# Patient Record
Sex: Female | Born: 1990 | Race: Black or African American | Hispanic: No | Marital: Single | Smoking: Never smoker
Health system: Southern US, Community
[De-identification: ages and names within clinical notes are randomized; demographics above are authoritative.]

---

## 2012-08-23 ENCOUNTER — Emergency Department (HOSPITAL_COMMUNITY)
Admission: EM | Admit: 2012-08-23 | Discharge: 2012-08-23 | Disposition: A | Discharge status: Home / Self Care | Payer: BC Managed Care – PPO | Attending: Emergency Medicine | Admitting: Emergency Medicine

## 2012-08-23 ENCOUNTER — Encounter (HOSPITAL_COMMUNITY): Payer: Self-pay | Admitting: Emergency Medicine

## 2012-08-23 DIAGNOSIS — Z79899 Other long term (current) drug therapy: Secondary | ICD-10-CM | POA: Insufficient documentation

## 2012-08-23 DIAGNOSIS — N946 Dysmenorrhea, unspecified: Secondary | ICD-10-CM | POA: Insufficient documentation

## 2012-08-23 DIAGNOSIS — R109 Unspecified abdominal pain: Secondary | ICD-10-CM | POA: Insufficient documentation

## 2012-08-23 DIAGNOSIS — Z3202 Encounter for pregnancy test, result negative: Secondary | ICD-10-CM | POA: Insufficient documentation

## 2012-08-23 MED ORDER — HYDROCODONE-ACETAMINOPHEN 5-325 MG PO TABS: 1.0000 | ORAL_TABLET | Freq: Four times a day (QID) | ORAL | Status: DC | PRN

## 2012-08-23 MED ORDER — NAPROXEN 500 MG PO TABS: 500.0000 mg | ORAL_TABLET | Freq: Two times a day (BID) | ORAL | Status: DC

## 2012-08-23 MED ORDER — HYDROMORPHONE HCL PF 1 MG/ML IJ SOLN
1.0000 mg | Freq: Once | INTRAMUSCULAR | Status: AC
  Administered 2012-08-23: 1 mg via INTRAMUSCULAR
  Filled 2012-08-23: qty 1

## 2012-08-23 NOTE — ED Notes (Signed)
Patient is on menstrual cycle that began around 0200 this morning.  Patient c/o N/V x 3 and abdominal cramps since 0400 this morning.  Patient states she does not have a OBGYN here and usually comes to the ER to get pain medicine when this happens (which occurs every few months).  Patient also reports heavy bleeding but denies clots.

## 2012-08-23 NOTE — ED Notes (Signed)
Bed:WA15<BR> Expected date:<BR> Expected time:<BR> Means of arrival:<BR> Comments:<BR> Triage 3 

## 2012-08-23 NOTE — ED Notes (Signed)
Pt reports pain in her lower pelvic area bilaterally and both sides.

## 2012-08-23 NOTE — ED Provider Notes (Signed)
History    CSN: [id number] Arrival date & time 08/23/12  1558 First MD Initiated Contact with Patient 08/23/12 1632      Chief Complaint  Patient presents with  . Menstrual Problem    cramps, N/V    HPI Pt presents to the ED with menstural cramping.  The pain is moderate to severe in the lower abdomen and radiates to her lower back and legs. She tried taking excedrin and motrin without relief.  Pt often has this problem every few months.  She has had to come to the emergency room in the past.  She has seen a GYN in the past and was told everything is OK.  She does not see a GYN regularly. She denies fever, or vaginal discharge.  She did vomiting three times.  No diarrhea.  No past medical history on file.  No past surgical history on file.  No family history on file.  History  Substance Use Topics  . Smoking status: Never Smoker   . Smokeless tobacco: Not on file  . Alcohol Use: No    OB History    Grav Para Term Preterm Abortions TAB SAB Ect Mult Living                  Review of Systems  All other systems reviewed and are negative.    Allergies  Review of patient's allergies indicates no known allergies.  Home Medications   Current Outpatient Rx  Name  Route  Sig  Dispense  Refill  . ASPIRIN-ACETAMINOPHEN-CAFFEINE 250-250-65 MG PO TABS   Oral   Take 2 tablets by mouth every 6 (six) hours as needed. For pain.         . IBUPROFEN 200 MG PO TABS   Oral   Take 400 mg by mouth every 8 (eight) hours as needed. For pain.           BP 99/59  Pulse 77  Temp 98.2 F (36.8 C)  Resp 20  Ht 5' (1.524 m)  Wt 118 lb (53.524 kg)  BMI 23.05 kg/m2  SpO2 100%  LMP 08/23/2012  Physical Exam  Nursing note and vitals reviewed. Constitutional: She appears well-developed and well-nourished. No distress.  HENT:  Head: Normocephalic and atraumatic.  Right Ear: External ear normal.  Left Ear: External ear normal.  Eyes: Conjunctivae normal are normal. Right eye  exhibits no discharge. Left eye exhibits no discharge. No scleral icterus.  Neck: Neck supple. No tracheal deviation present.  Cardiovascular: Normal rate, regular rhythm and intact distal pulses.   Pulmonary/Chest: Effort normal and breath sounds normal. No stridor. No respiratory distress. She has no wheezes. She has no rales.  Abdominal: Soft. Bowel sounds are normal. She exhibits no distension. There is tenderness in the suprapubic area. There is no rebound and no guarding. No hernia.  Genitourinary: There is no rash, tenderness or lesion on the right labia. There is no rash, tenderness or lesion on the left labia. Uterus is tender. Uterus is not enlarged. Cervix exhibits no motion tenderness. Right adnexum displays no mass. Left adnexum displays no mass. There is bleeding around the vagina. No tenderness around the vagina. No foreign body around the vagina. No signs of injury around the vagina.       No clots, mild to mod bleeding from os  Musculoskeletal: She exhibits no edema and no tenderness.  Neurological: She is alert. She has normal strength. No sensory deficit. Cranial nerve deficit:  no gross defecits noted.  She exhibits normal muscle tone. She displays no seizure activity. Coordination normal.  Skin: Skin is warm and dry. No rash noted.  Psychiatric: She has a normal mood and affect.    ED Course  Procedures (including critical care time)   Labs Reviewed  POCT PREGNANCY, URINE  GC/CHLAMYDIA PROBE AMP  RPR   No results found.   1. Dysmenorrhea       MDM  Encouraged outpt follow up with a GYN.   Will dc home with nsaids and norco.  At this time there does not appear to be any evidence of an acute emergency medical condition and the patient appears stable for discharge with appropriate outpatient follow up.         Celene Kras, MD 08/23/12 [phone]

## 2013-05-18 ENCOUNTER — Emergency Department (HOSPITAL_BASED_OUTPATIENT_CLINIC_OR_DEPARTMENT_OTHER)
Admission: EM | Admit: 2013-05-18 | Discharge: 2013-05-18 | Disposition: A | Discharge status: Home / Self Care | Payer: BC Managed Care – PPO | Attending: Emergency Medicine | Admitting: Emergency Medicine

## 2013-05-18 ENCOUNTER — Encounter (HOSPITAL_BASED_OUTPATIENT_CLINIC_OR_DEPARTMENT_OTHER): Payer: Self-pay

## 2013-05-18 DIAGNOSIS — Z791 Long term (current) use of non-steroidal anti-inflammatories (NSAID): Secondary | ICD-10-CM | POA: Insufficient documentation

## 2013-05-18 DIAGNOSIS — N946 Dysmenorrhea, unspecified: Secondary | ICD-10-CM

## 2013-05-18 DIAGNOSIS — Z3202 Encounter for pregnancy test, result negative: Secondary | ICD-10-CM | POA: Insufficient documentation

## 2013-05-18 LAB — URINE MICROSCOPIC-ADD ON

## 2013-05-18 LAB — URINALYSIS, ROUTINE W REFLEX MICROSCOPIC
Glucose, UA: NEGATIVE mg/dL
Leukocytes, UA: NEGATIVE
Specific Gravity, Urine: 1.031 — ABNORMAL HIGH (ref 1.005–1.030)
pH: 8.5 — ABNORMAL HIGH (ref 5.0–8.0)

## 2013-05-18 LAB — WET PREP, GENITAL: Yeast Wet Prep HPF POC: NONE SEEN

## 2013-05-18 MED ORDER — SODIUM CHLORIDE 0.9 % IV BOLUS (SEPSIS)
1000.0000 mL | Freq: Once | INTRAVENOUS | Status: AC
  Administered 2013-05-18: 1000 mL via INTRAVENOUS

## 2013-05-18 MED ORDER — KETOROLAC TROMETHAMINE 30 MG/ML IJ SOLN
30.0000 mg | Freq: Once | INTRAMUSCULAR | Status: AC
  Administered 2013-05-18: 30 mg via INTRAVENOUS
  Filled 2013-05-18: qty 1

## 2013-05-18 MED ORDER — ONDANSETRON HCL 4 MG/2ML IJ SOLN
4.0000 mg | INTRAMUSCULAR | Status: AC
  Administered 2013-05-18: 4 mg via INTRAVENOUS
  Filled 2013-05-18: qty 2

## 2013-05-18 MED ORDER — IBUPROFEN 800 MG PO TABS: 800.0000 mg | ORAL_TABLET | Freq: Three times a day (TID) | ORAL | Status: DC

## 2013-05-18 NOTE — ED Notes (Signed)
Patient reports that she started her menstrual cycle today and has severe cramps. Reports took hydrocodone without relief, here requesting dilaudid. Reports that she gets that from her doctor for cramps

## 2013-05-18 NOTE — ED Provider Notes (Signed)
CSN: [id number]     Arrival date & time 05/18/13  1413 History   First MD Initiated Contact with Patient 05/18/13 1426     Chief Complaint  Patient presents with  . Menstrual Problem   (Consider location/radiation/quality/duration/timing/severity/associated sxs/prior Treatment) HPI Comments: Patient is a 22 year old female with a history of dysmenorrhea presents for painful menstrual cramping with onset this morning. Patient states that pain is present in her suprapubic region and radiating around to her right and left back. Patient denies any alleviating factors of her symptoms and states she took Vicodin without relief. Patient denies any aggravating factors of her symptoms and states the pain has been constant since onset. She describes the pain as a cramping, sharp sensation. She endorses similar episodes of dysmenorrhea in the past and states she began her menstrual cycle this morning. States that "dilaudid usually works" for her. Patient admits to associated nausea and denies associated fever, chest pain or shortness of breath, vomiting, diarrhea, melena or hematochezia, and urinary symptoms. Last BM yesterday which was normal in color and consistency.  The history is provided by the patient. No language interpreter was used.    History reviewed. No pertinent past medical history. History reviewed. No pertinent past surgical history. No family history on file. History  Substance Use Topics  . Smoking status: Never Smoker   . Smokeless tobacco: Not on file  . Alcohol Use: No   OB History   Grav Para Term Preterm Abortions TAB SAB Ect Mult Living                 Review of Systems  Constitutional: Negative for fever.  Respiratory: Negative for shortness of breath.   Cardiovascular: Negative for chest pain.  Gastrointestinal: Positive for nausea and abdominal pain. Negative for vomiting and diarrhea.  Genitourinary: Positive for vaginal bleeding (c/w menses) and pelvic pain. Negative  for dysuria and vaginal pain.  All other systems reviewed and are negative.   Allergies  Review of patient's allergies indicates no known allergies.  Home Medications   Current Outpatient Rx  Name  Route  Sig  Dispense  Refill  . HYDROcodone-acetaminophen (NORCO) 5-325 MG per tablet   Oral   Take 1-2 tablets by mouth every 6 (six) hours as needed for pain.   16 tablet   0   . ibuprofen (ADVIL,MOTRIN) 800 MG tablet   Oral   Take 1 tablet (800 mg total) by mouth 3 (three) times daily.   21 tablet   0    BP 111/55  Pulse 91  Temp(Src) 99.1 F (37.3 C) (Oral)  Resp 18  LMP 05/18/2013  Physical Exam  Nursing note and vitals reviewed. Constitutional: She is oriented to person, place, and time. She appears well-developed and well-nourished. No distress.  HENT:  Head: Normocephalic and atraumatic.  Eyes: Conjunctivae and EOM are normal. No scleral icterus.  Neck: Normal range of motion.  Cardiovascular: Normal rate, regular rhythm, normal heart sounds and intact distal pulses.   Pulmonary/Chest: Effort normal and breath sounds normal. No respiratory distress. She has no wheezes. She has no rales.  Abdominal: Soft. Bowel sounds are normal. She exhibits no distension and no mass. There is tenderness (suprapubic). There is guarding (voluntary). There is no rebound.  Genitourinary: Uterus normal. There is no rash, tenderness, lesion or injury on the right labia. There is no rash, tenderness, lesion or injury on the left labia. Uterus is not tender. Cervix exhibits no motion tenderness and no friability. Right adnexum  displays no mass, no tenderness and no fullness. Left adnexum displays no mass, no tenderness and no fullness. No tenderness around the vagina. No signs of injury around the vagina. No vaginal discharge found.  Blood from cervix and in vaginal vault consistent with menstruation. No adnexal, uterine, or cervical motion tenderness on chaperoned GYN exam.  Musculoskeletal:  Normal range of motion.  Neurological: She is alert and oriented to person, place, and time.  Skin: Skin is warm and dry. No rash noted. She is not diaphoretic. No erythema. No pallor.  Psychiatric: She has a normal mood and affect. Her behavior is normal.    ED Course  Procedures (including critical care time) Labs Review Labs Reviewed  WET PREP, GENITAL - Abnormal; Notable for the following:    Clue Cells Wet Prep HPF POC FEW (*)    WBC, Wet Prep HPF POC MANY (*)    All other components within normal limits  URINALYSIS, ROUTINE W REFLEX MICROSCOPIC - Abnormal; Notable for the following:    Color, Urine AMBER (*)    Specific Gravity, Urine 1.031 (*)    pH 8.5 (*)    Hgb urine dipstick LARGE (*)    Ketones, ur >80 (*)    Protein, ur 30 (*)    All other components within normal limits  URINE MICROSCOPIC-ADD ON - Abnormal; Notable for the following:    Bacteria, UA MANY (*)    All other components within normal limits  GC/CHLAMYDIA PROBE AMP  PREGNANCY, URINE   Imaging Review No results found.  MDM   1. Dysmenorrhea    22 year old female with a history of dysmenorrhea who presents for abdominal pain with onset of her menses today. Patient well and nontoxic appearing, afebrile, and hemodynamically stable. Physical exam findings as above. There is no adnexal tenderness, cervical motion tenderness, or uterine tenderness on GYN exam. Urine without evidence of infection; leukocytes and nitrates negative. Urine pregnancy negative. Tenderness to palpation of the abdomen improved on reexamination after patient given Toradol and IVF in ED. Patient also endorsing significant improvement in her symptoms since arrival. Do not believe further workup and imaging is warranted at this time given improved abdominal exam and known hx of dysmenorrhea. Believe patient to be appropriate for discharge with OB/GYN followup for further evaluation of symptoms. 800 mg ibuprofen prescribed for pain control and  referral to women's outpatient clinic provided. Return precautions discussed and patient are agreeable to plan with no unaddressed concerns.    Antony Madura, PA-C 05/18/13 1657

## 2013-05-21 NOTE — ED Provider Notes (Signed)
Medical screening examination/treatment/procedure(s) were performed by non-physician practitioner and as supervising physician I was immediately available for consultation/collaboration.  Derwood Kaplan, MD 05/21/13 2024

## 2013-11-16 ENCOUNTER — Emergency Department (HOSPITAL_COMMUNITY)
Admission: EM | Admit: 2013-11-16 | Discharge: 2013-11-16 | Disposition: A | Discharge status: Home / Self Care | Payer: BC Managed Care – PPO | Attending: Emergency Medicine | Admitting: Emergency Medicine

## 2013-11-16 ENCOUNTER — Encounter (HOSPITAL_COMMUNITY): Payer: Self-pay | Admitting: Emergency Medicine

## 2013-11-16 DIAGNOSIS — K648 Other hemorrhoids: Secondary | ICD-10-CM | POA: Insufficient documentation

## 2013-11-16 DIAGNOSIS — K645 Perianal venous thrombosis: Secondary | ICD-10-CM | POA: Insufficient documentation

## 2013-11-16 DIAGNOSIS — K625 Hemorrhage of anus and rectum: Secondary | ICD-10-CM | POA: Insufficient documentation

## 2013-11-16 DIAGNOSIS — K5900 Constipation, unspecified: Secondary | ICD-10-CM | POA: Insufficient documentation

## 2013-11-16 MED ORDER — IBUPROFEN 600 MG PO TABS: 600.0000 mg | ORAL_TABLET | Freq: Four times a day (QID) | ORAL | Status: DC | PRN

## 2013-11-16 MED ORDER — OXYCODONE-ACETAMINOPHEN 5-325 MG PO TABS
1.0000 | ORAL_TABLET | Freq: Once | ORAL | Status: AC
  Administered 2013-11-16: 1 via ORAL
  Filled 2013-11-16: qty 1

## 2013-11-16 MED ORDER — HYDROCODONE-ACETAMINOPHEN 5-325 MG PO TABS: 1.0000 | ORAL_TABLET | ORAL | Status: DC | PRN

## 2013-11-16 NOTE — Discharge Instructions (Signed)
Call for a follow up appointment with a Family or Primary Care Provider.  Return if Symptoms worsen.   Take medication as prescribed.Continue Sitz baths.

## 2013-11-16 NOTE — ED Provider Notes (Signed)
Medical screening examination/treatment/procedure(s) were conducted as a shared visit with non-physician practitioner(s) and myself.  I personally evaluated the patient during the encounter.   EKG Interpretation None      The patient seen by me. Patient with complaint of hemorrhoidal pain since Wednesday that was 3-4 days ago. The patient was sent in from urgent care for evaluation here. Patient on rectal examination has a thrombosed external hemorrhoid on the left side of the anus. Area thrombus is noted. Patient will be treated symptomatically since his been several days since the onset, these tend to improve at this point over the next couple days will treat with sitz baths and pain control. Do not recommend opening the thrombosed hemorrhoid at this time. Natural history of the external thrombosed hemorrhoids that after 2-3 days they start to improve naturally as the clot starts to break down.  Shelda JakesScott W. Kehlani Vancamp, MD 11/16/13 1700

## 2013-11-16 NOTE — ED Notes (Signed)
PA at bedside.

## 2013-11-16 NOTE — ED Notes (Signed)
Pt reports having an extremal hemorrhoid. Pt reports being seen at urgent care and was referred to ED. Pt standing in triage and doesn't want to sit down. Pt is A/O x4, in NAD, and vitals are WDL.

## 2013-11-16 NOTE — ED Provider Notes (Signed)
CSN: [id number]     Arrival date & time 11/16/13  1607 History   First MD Initiated Contact with Patient 11/16/13 1618     Chief Complaint  Patient presents with  . Hemorrhoids     (Consider location/radiation/quality/duration/timing/severity/associated sxs/prior Treatment) HPI Comments: Megan Sharp is a 23 y.o. Female, presenting the Emergency Department with a chief complaint of worsening rectal pain for 3 dyas.  The patient reports she was constipated for several days prior to the pain and swelling.  She reports Sitz baths, preparation H, and drinking salt water to loosen her stools.  She reports occasional blood on toilet paper.    The history is provided by the patient and medical records. No language interpreter was used.    History reviewed. No pertinent past medical history. History reviewed. No pertinent past surgical history. No family history on file. History  Substance Use Topics  . Smoking status: Never Smoker   . Smokeless tobacco: Never Used  . Alcohol Use: No   OB History   Grav Para Term Preterm Abortions TAB SAB Ect Mult Living                 Review of Systems  Gastrointestinal: Positive for constipation, anal bleeding and rectal pain.      Allergies  Review of patient's allergies indicates no known allergies.  Home Medications   Current Outpatient Rx  Name  Route  Sig  Dispense  Refill  . HYDROcodone-acetaminophen (NORCO) 5-325 MG per tablet   Oral   Take 1-2 tablets by mouth every 6 (six) hours as needed for pain.   16 tablet   0   . ibuprofen (ADVIL,MOTRIN) 800 MG tablet   Oral   Take 1 tablet (800 mg total) by mouth 3 (three) times daily.   21 tablet   0    BP 112/71  Pulse 77  Temp(Src) 98.3 F (36.8 C) (Oral)  Resp 18  SpO2 100%  LMP 11/16/2013 Physical Exam  Nursing note and vitals reviewed. Constitutional: She is oriented to person, place, and time. She appears well-developed and well-nourished.  Appears uncomfortable  HENT:   Head: Normocephalic and atraumatic.  Neck: Neck supple.  Genitourinary: Rectal exam shows external hemorrhoid, internal hemorrhoid and tenderness. Rectal exam shows no fissure.  Single thrombosed external hemorrhoid at the 8 o'clock position. Moderately to severely tender to palpation.   Musculoskeletal: Normal range of motion.  Neurological: She is alert and oriented to person, place, and time.  Skin: Skin is warm and dry.    ED Course  Procedures (including critical care time) Labs Review Labs Reviewed - No data to display Imaging Review No results found.   EKG Interpretation None      MDM   Final diagnoses:  Thrombosed external hemorrhoid   Hemorrhoids  Pt to ER with external thrombosed hemorrhoid. Home care discussed including sitz baths 15 min TID. Dc pain medications, increase water to stool softener and recommendation f-u with surgery or GI. Pt w normal VS and in NAD prior to dc.  Meds given in ED:  Medications  oxyCODONE-acetaminophen (PERCOCET/ROXICET) 5-325 MG per tablet 1 tablet (1 tablet Oral Given 11/16/13 1721)    Discharge Medication List as of 11/16/2013  5:37 PM    START taking these medications   Details  HYDROcodone-acetaminophen (NORCO/VICODIN) 5-325 MG per tablet Take 1 tablet by mouth every 4 (four) hours as needed., Starting 11/16/2013, Until Discontinued, Print    !! ibuprofen (ADVIL,MOTRIN) 600 MG tablet Take 1  tablet (600 mg total) by mouth every 6 (six) hours as needed. Take with meals, Starting 11/16/2013, Until Discontinued, Print     !! - Potential duplicate medications found. Please discuss with provider.         Clabe SealLauren M Leylani Duley, PA-C 11/18/13 2107

## 2014-02-15 ENCOUNTER — Encounter (HOSPITAL_COMMUNITY): Payer: Self-pay | Admitting: Emergency Medicine

## 2014-02-15 ENCOUNTER — Emergency Department (HOSPITAL_COMMUNITY)
Admission: EM | Admit: 2014-02-15 | Discharge: 2014-02-16 | Disposition: A | Discharge status: Home / Self Care | Payer: BC Managed Care – PPO | Attending: Emergency Medicine | Admitting: Emergency Medicine

## 2014-02-15 DIAGNOSIS — Z792 Long term (current) use of antibiotics: Secondary | ICD-10-CM | POA: Insufficient documentation

## 2014-02-15 DIAGNOSIS — N12 Tubulo-interstitial nephritis, not specified as acute or chronic: Secondary | ICD-10-CM | POA: Insufficient documentation

## 2014-02-15 DIAGNOSIS — Z7982 Long term (current) use of aspirin: Secondary | ICD-10-CM | POA: Insufficient documentation

## 2014-02-15 LAB — COMPREHENSIVE METABOLIC PANEL
ALT: 22 U/L (ref 0–35)
AST: 19 U/L (ref 0–37)
Albumin: 3.8 g/dL (ref 3.5–5.2)
Alkaline Phosphatase: 60 U/L (ref 39–117)
BUN: 14 mg/dL (ref 6–23)
CALCIUM: 9.1 mg/dL (ref 8.4–10.5)
CO2: 26 meq/L (ref 19–32)
CREATININE: 0.68 mg/dL (ref 0.50–1.10)
Chloride: 104 mEq/L (ref 96–112)
GLUCOSE: 92 mg/dL (ref 70–99)
Potassium: 3.7 mEq/L (ref 3.7–5.3)
SODIUM: 142 meq/L (ref 137–147)
TOTAL PROTEIN: 7.4 g/dL (ref 6.0–8.3)
Total Bilirubin: 0.2 mg/dL — ABNORMAL LOW (ref 0.3–1.2)

## 2014-02-15 LAB — CBC WITH DIFFERENTIAL/PLATELET
BASOS ABS: 0.1 10*3/uL (ref 0.0–0.1)
Basophils Relative: 1 % (ref 0–1)
EOS ABS: 0.1 10*3/uL (ref 0.0–0.7)
EOS PCT: 1 % (ref 0–5)
HEMATOCRIT: 35.3 % — AB (ref 36.0–46.0)
Hemoglobin: 11.8 g/dL — ABNORMAL LOW (ref 12.0–15.0)
LYMPHS ABS: 3.3 10*3/uL (ref 0.7–4.0)
LYMPHS PCT: 52 % — AB (ref 12–46)
MCH: 28.6 pg (ref 26.0–34.0)
MCHC: 33.4 g/dL (ref 30.0–36.0)
MCV: 85.5 fL (ref 78.0–100.0)
MONO ABS: 0.6 10*3/uL (ref 0.1–1.0)
Monocytes Relative: 9 % (ref 3–12)
Neutro Abs: 2.3 10*3/uL (ref 1.7–7.7)
Neutrophils Relative %: 37 % — ABNORMAL LOW (ref 43–77)
PLATELETS: 265 10*3/uL (ref 150–400)
RBC: 4.13 MIL/uL (ref 3.87–5.11)
RDW: 12 % (ref 11.5–15.5)
WBC: 6.2 10*3/uL (ref 4.0–10.5)

## 2014-02-15 LAB — URINALYSIS, ROUTINE W REFLEX MICROSCOPIC
BILIRUBIN URINE: NEGATIVE
GLUCOSE, UA: NEGATIVE mg/dL
KETONES UR: NEGATIVE mg/dL
NITRITE: NEGATIVE
Protein, ur: NEGATIVE mg/dL
Specific Gravity, Urine: 1.016 (ref 1.005–1.030)
Urobilinogen, UA: 0.2 mg/dL (ref 0.0–1.0)
pH: 7 (ref 5.0–8.0)

## 2014-02-15 LAB — PREGNANCY, URINE: PREG TEST UR: NEGATIVE

## 2014-02-15 LAB — URINE MICROSCOPIC-ADD ON

## 2014-02-15 LAB — LIPASE, BLOOD: LIPASE: 24 U/L (ref 11–59)

## 2014-02-15 MED ORDER — ONDANSETRON HCL 4 MG/2ML IJ SOLN
4.0000 mg | Freq: Once | INTRAMUSCULAR | Status: AC
  Administered 2014-02-15: 4 mg via INTRAVENOUS
  Filled 2014-02-15: qty 2

## 2014-02-15 MED ORDER — SODIUM CHLORIDE 0.9 % IV BOLUS (SEPSIS)
1000.0000 mL | Freq: Once | INTRAVENOUS | Status: AC
  Administered 2014-02-15: 1000 mL via INTRAVENOUS

## 2014-02-15 MED ORDER — MORPHINE SULFATE 4 MG/ML IJ SOLN
4.0000 mg | Freq: Once | INTRAMUSCULAR | Status: AC
  Administered 2014-02-15: 4 mg via INTRAVENOUS
  Filled 2014-02-15: qty 1

## 2014-02-15 MED ORDER — CIPROFLOXACIN HCL 500 MG PO TABS: 500.0000 mg | ORAL_TABLET | Freq: Two times a day (BID) | ORAL | Status: DC

## 2014-02-15 MED ORDER — DEXTROSE 5 % IV SOLN
1.0000 g | Freq: Once | INTRAVENOUS | Status: AC
  Administered 2014-02-15: 1 g via INTRAVENOUS
  Filled 2014-02-15: qty 10

## 2014-02-15 MED ORDER — ONDANSETRON HCL 4 MG PO TABS: 4.0000 mg | ORAL_TABLET | Freq: Four times a day (QID) | ORAL | Status: DC

## 2014-02-15 MED ORDER — HYDROCODONE-ACETAMINOPHEN 5-325 MG PO TABS: 1.0000 | ORAL_TABLET | ORAL | Status: DC | PRN

## 2014-02-15 NOTE — Discharge Instructions (Signed)

## 2014-02-15 NOTE — ED Notes (Signed)
Pt presents with c/o bilateral flank and abdominal pain as well. Pt says that she is currently on her cycle and she has been passing some blood clots today. Pt denies N/V/D. No hx of kidney stones, blood in her urine, or pain with urination. Pt is in NAD at this time.

## 2014-02-15 NOTE — ED Provider Notes (Signed)
CSN: [id number]     Arrival date & time 02/15/14  2128 History   First MD Initiated Contact with Patient 02/15/14 2145     Chief Complaint  Patient presents with  . Flank Pain  . Abdominal Pain     (Consider location/radiation/quality/duration/timing/severity/associated sxs/prior Treatment) HPI  Patient presents to the ER with complaints of bilateral flank pain that started yesterday. She describes it as being mild and uncomfortable starting yesterday but then today it started to become more painful. She is also on her menstrual cycle and has been passing blood clots which are larger than normal. She has not had any n,v,d. Denies hematuria, vaginal pain or abnormal discharge. The pain does not worsen with movement. Denies suprapubic pain. NAD at this time but accepts offer for pain medications.  History reviewed. No pertinent past medical history. History reviewed. No pertinent past surgical history. No family history on file. History  Substance Use Topics  . Smoking status: Never Smoker   . Smokeless tobacco: Never Used  . Alcohol Use: No   OB History   Grav Para Term Preterm Abortions TAB SAB Ect Mult Living                 Review of Systems   Review of Systems  Gen: no weight loss, fevers, chills, night sweats  Eyes: no discharge or drainage, no occular pain or visual changes  Nose: no epistaxis or rhinorrhea  Mouth: no dental pain, no sore throat  Neck: no neck pain  Lungs:No wheezing, coughing or hemoptysis CV: no chest pain, palpitations, dependent edema or orthopnea  Abd: + bilateral flank pain, NO nausea, vomiting, diarrhea GU: no dysuria or gross hematuria , + menstrual cramps MSK:  No muscle weakness or pain Neuro: no headache, no focal neurologic deficits  Skin: no rash or wounds Psyche: no complaints    Allergies  Review of patient's allergies indicates no known allergies.  Home Medications   Prior to Admission medications   Medication Sig Start Date  End Date Taking? Authorizing Provider  aspirin-acetaminophen-caffeine (EXCEDRIN EXTRA STRENGTH) [id number] MG per tablet Take 2 tablets by mouth every 6 (six) hours as needed for headache or migraine.   Yes Historical Provider, MD  ciprofloxacin (CIPRO) 500 MG tablet Take 1 tablet (500 mg total) by mouth 2 (two) times daily. 02/15/14   Hoyt Leanos Marilu Favre, PA-C  HYDROcodone-acetaminophen (NORCO/VICODIN) 5-325 MG per tablet Take 1-2 tablets by mouth every 4 (four) hours as needed. 02/15/14   Abril Cappiello Marilu Favre, PA-C  ondansetron (ZOFRAN) 4 MG tablet Take 1 tablet (4 mg total) by mouth every 6 (six) hours. 02/15/14   Kristianne Albin Marilu Favre, PA-C   BP 100/68  Pulse 60  Temp(Src) 97.9 F (36.6 C) (Oral)  Resp 18  SpO2 100%  LMP 02/15/2014 Physical Exam  Nursing note and vitals reviewed. Constitutional: She appears well-developed and well-nourished. No distress.  HENT:  Head: Normocephalic and atraumatic.  Eyes: Pupils are equal, round, and reactive to light.  Neck: Normal range of motion. Neck supple.  Cardiovascular: Normal rate and regular rhythm.   Pulmonary/Chest: Effort normal.  Abdominal: Soft. Bowel sounds are normal. She exhibits no distension. There is tenderness. There is CVA tenderness. There is no rigidity, no rebound, no guarding and negative Murphy's sign.  No suprapubic pain  Neurological: She is alert.  Skin: Skin is warm and dry.    ED Course  Procedures (including critical care time) Labs Review Labs Reviewed  CBC WITH DIFFERENTIAL - Abnormal; Notable  for the following:    Hemoglobin 11.8 (*)    HCT 35.3 (*)    Neutrophils Relative % 37 (*)    Lymphocytes Relative 52 (*)    All other components within normal limits  COMPREHENSIVE METABOLIC PANEL - Abnormal; Notable for the following:    Total Bilirubin 0.2 (*)    All other components within normal limits  URINALYSIS, ROUTINE W REFLEX MICROSCOPIC - Abnormal; Notable for the following:    APPearance CLOUDY (*)    Hgb urine  dipstick LARGE (*)    Leukocytes, UA MODERATE (*)    All other components within normal limits  URINE MICROSCOPIC-ADD ON - Abnormal; Notable for the following:    Bacteria, UA MANY (*)    All other components within normal limits  URINE CULTURE  LIPASE, BLOOD  PREGNANCY, URINE    Imaging Review No results found.   EKG Interpretation None      MDM   Final diagnoses:  Pyelonephritis    Patient has WNL white count on CBC and no n,v,d. Her bilateral flank pain and urinalysis are consistent with early pyelonephritis. Will treat with IV Rocephin 1 g, pain and nausea medications. She does not met criteria for inpatient and will be discharged with pain and nausea medication as well as Cipro for 2 weeks. Urine culture sent out. Patient is comfortable with diagnosis and plan. She appears well and pain controlled in the ED.  23 y.o.Megan Sharp's evaluation in the Emergency Department is complete. It has been determined that no acute conditions requiring further emergency intervention are present at this time. The patient/guardian have been advised of the diagnosis and plan. We have discussed signs and symptoms that warrant return to the ED, such as changes or worsening in symptoms.  Vital signs are stable at discharge. Filed Vitals:   02/16/14 0205  BP: 100/68  Pulse: 60  Temp: 97.9 F (36.6 C)  Resp: 18    Patient/guardian has voiced understanding and agreed to follow-up with the PCP or specialist.     Linus Mako, PA-C 02/16/14 1020  Linus Mako, PA-C 02/16/14 1020

## 2014-02-16 NOTE — ED Provider Notes (Signed)
Medical screening examination/treatment/procedure(s) were performed by non-physician practitioner and as supervising physician I was immediately available for consultation/collaboration.   EKG Interpretation None       Hurman Horn, MD 02/16/14 1441

## 2014-02-18 LAB — URINE CULTURE: Special Requests: NORMAL

## 2014-02-23 ENCOUNTER — Telehealth (HOSPITAL_BASED_OUTPATIENT_CLINIC_OR_DEPARTMENT_OTHER): Payer: Self-pay | Admitting: Emergency Medicine

## 2014-02-23 NOTE — Telephone Encounter (Signed)
+  Urine. Per pharmacist, patient treated with Cipro. Sensitive to same.

## 2014-08-28 ENCOUNTER — Emergency Department (HOSPITAL_COMMUNITY)
Admission: EM | Admit: 2014-08-28 | Discharge: 2014-08-29 | Disposition: A | Discharge status: Home / Self Care | Payer: BC Managed Care – PPO | Attending: Emergency Medicine | Admitting: Emergency Medicine

## 2014-08-28 ENCOUNTER — Encounter (HOSPITAL_COMMUNITY): Payer: Self-pay | Admitting: Emergency Medicine

## 2014-08-28 DIAGNOSIS — Z7982 Long term (current) use of aspirin: Secondary | ICD-10-CM | POA: Diagnosis not present

## 2014-08-28 DIAGNOSIS — R002 Palpitations: Secondary | ICD-10-CM | POA: Insufficient documentation

## 2014-08-28 DIAGNOSIS — R0602 Shortness of breath: Secondary | ICD-10-CM | POA: Diagnosis not present

## 2014-08-28 DIAGNOSIS — Z3202 Encounter for pregnancy test, result negative: Secondary | ICD-10-CM | POA: Diagnosis not present

## 2014-08-28 DIAGNOSIS — R42 Dizziness and giddiness: Secondary | ICD-10-CM | POA: Insufficient documentation

## 2014-08-28 DIAGNOSIS — Z79899 Other long term (current) drug therapy: Secondary | ICD-10-CM | POA: Diagnosis not present

## 2014-08-28 DIAGNOSIS — Z792 Long term (current) use of antibiotics: Secondary | ICD-10-CM | POA: Insufficient documentation

## 2014-08-28 DIAGNOSIS — R531 Weakness: Secondary | ICD-10-CM | POA: Diagnosis not present

## 2014-08-28 NOTE — ED Notes (Signed)
Pt states tonight about an hour prior to arrival she started feeling short of breath and light headed  Pt states her ribs and kidney area hurt

## 2014-08-29 LAB — CBC WITH DIFFERENTIAL/PLATELET
BASOS PCT: 1 % (ref 0–1)
Basophils Absolute: 0 10*3/uL (ref 0.0–0.1)
EOS ABS: 0 10*3/uL (ref 0.0–0.7)
Eosinophils Relative: 1 % (ref 0–5)
HCT: 35.9 % — ABNORMAL LOW (ref 36.0–46.0)
HEMOGLOBIN: 12 g/dL (ref 12.0–15.0)
Lymphocytes Relative: 45 % (ref 12–46)
Lymphs Abs: 2.2 10*3/uL (ref 0.7–4.0)
MCH: 28.1 pg (ref 26.0–34.0)
MCHC: 33.4 g/dL (ref 30.0–36.0)
MCV: 84.1 fL (ref 78.0–100.0)
MONO ABS: 0.6 10*3/uL (ref 0.1–1.0)
MONOS PCT: 13 % — AB (ref 3–12)
Neutro Abs: 1.9 10*3/uL (ref 1.7–7.7)
Neutrophils Relative %: 40 % — ABNORMAL LOW (ref 43–77)
Platelets: 240 10*3/uL (ref 150–400)
RBC: 4.27 MIL/uL (ref 3.87–5.11)
RDW: 12.1 % (ref 11.5–15.5)
WBC: 4.8 10*3/uL (ref 4.0–10.5)

## 2014-08-29 LAB — I-STAT BETA HCG BLOOD, ED (MC, WL, AP ONLY): I-stat hCG, quantitative: <5 m[IU]/mL (ref <5)

## 2014-08-29 LAB — BASIC METABOLIC PANEL
Anion gap: 12 (ref 5–15)
BUN: 9 mg/dL (ref 6–23)
CHLORIDE: 102 meq/L (ref 96–112)
CO2: 24 mEq/L (ref 19–32)
Calcium: 9.4 mg/dL (ref 8.4–10.5)
Creatinine, Ser: 0.66 mg/dL (ref 0.50–1.10)
Glucose, Bld: 124 mg/dL — ABNORMAL HIGH (ref 70–99)
Potassium: 4.3 mEq/L (ref 3.7–5.3)
Sodium: 138 mEq/L (ref 137–147)

## 2014-08-29 LAB — MAGNESIUM: Magnesium: 2.1 mg/dL (ref 1.5–2.5)

## 2014-08-29 NOTE — ED Provider Notes (Addendum)
CSN: [id number]     Arrival date & time 08/28/14  2253 History   First MD Initiated Contact with Patient 08/28/14 2341     Chief Complaint  Patient presents with  . Shortness of Breath  . Dizziness     (Consider location/radiation/quality/duration/timing/severity/associated sxs/prior Treatment) HPI  Megan Sharp is a 23 y.o. female with no significant past medical history coming in with sudden onset palpitations and shortness of breath. Patient states this began an hour prior to arrival while laying in her bed talking on the phone. She states when she stood up she became warm and dizzy as well. She denies any syncope. She has bilateral rib pain but denies any, fevers, infections. She is currently on her menstrual cycle and states it has been normal. She denies any medications. She denies any history of blood clots, long periods of immobilization, or hormone therapy. This has never happened to her in the past. Patient's no further complaints.  10 Systems reviewed and are negative for acute change except as noted in the HPI.    History reviewed. No pertinent past medical history. History reviewed. No pertinent past surgical history. Family History  Problem Relation Age of Onset  . Hypertension Other   . Diabetes Other    History  Substance Use Topics  . Smoking status: Never Smoker   . Smokeless tobacco: Never Used  . Alcohol Use: No   OB History    No data available     Review of Systems    Allergies  Review of patient's allergies indicates no known allergies.  Home Medications   Prior to Admission medications   Medication Sig Start Date End Date Taking? Authorizing Provider  aspirin-acetaminophen-caffeine (EXCEDRIN EXTRA STRENGTH) [phone] MG per tablet Take 2 tablets by mouth every 6 (six) hours as needed for headache or migraine.    Historical Provider, MD  ciprofloxacin (CIPRO) 500 MG tablet Take 1 tablet (500 mg total) by mouth 2 (two) times daily. 02/15/14   Tiffany  Irine SealG Greene, PA-C  HYDROcodone-acetaminophen (NORCO/VICODIN) 5-325 MG per tablet Take 1-2 tablets by mouth every 4 (four) hours as needed. 02/15/14   Tiffany Irine SealG Greene, PA-C  ondansetron (ZOFRAN) 4 MG tablet Take 1 tablet (4 mg total) by mouth every 6 (six) hours. 02/15/14   Tiffany Irine SealG Greene, PA-C   BP 113/60 mmHg  Pulse 84  Temp(Src) 98.1 F (36.7 C) (Oral)  Resp 16  SpO2 100%  LMP 08/02/2014 (Exact Date) Physical Exam  Constitutional: She is oriented to person, place, and time. She appears well-developed and well-nourished. No distress.  HENT:  Head: Normocephalic and atraumatic.  Nose: Nose normal.  Mouth/Throat: No oropharyngeal exudate.  Eyes: Conjunctivae and EOM are normal. Pupils are equal, round, and reactive to light. No scleral icterus.  Neck: Normal range of motion. Neck supple. No JVD present. No tracheal deviation present. No thyromegaly present.  Cardiovascular: Normal rate, regular rhythm and normal heart sounds.  Exam reveals no gallop and no friction rub.   No murmur heard. Pulmonary/Chest: Effort normal and breath sounds normal. No respiratory distress. She has no wheezes. She exhibits no tenderness.  Abdominal: Soft. Bowel sounds are normal. She exhibits no distension and no mass. There is no tenderness. There is no rebound and no guarding.  Musculoskeletal: Normal range of motion. She exhibits no edema or tenderness.  Lymphadenopathy:    She has no cervical adenopathy.  Neurological: She is alert and oriented to person, place, and time. No cranial nerve deficit. She exhibits  normal muscle tone.  Normal strength and sensation 4 extremities.  Skin: Skin is warm and dry. No rash noted. No erythema. No pallor.  Nursing note and vitals reviewed.   ED Course  Procedures (including critical care time) Labs Review Labs Reviewed  CBC WITH DIFFERENTIAL - Abnormal; Notable for the following:    HCT 35.9 (*)    Neutrophils Relative % 40 (*)    Monocytes Relative 13 (*)     All other components within normal limits  BASIC METABOLIC PANEL - Abnormal; Notable for the following:    Glucose, Bld 124 (*)    All other components within normal limits  MAGNESIUM  I-STAT BETA HCG BLOOD, ED ([facility], [facility], [facility] ONLY)    Imaging Review No results found.   EKG Interpretation   Date/Time:  Thursday August 28 2014 23:36:24 EST Ventricular Rate:  92 PR Interval:  130 QRS Duration: 74 QT Interval:  333 QTC Calculation: 412 R Axis:   67 Text Interpretation:  Sinus rhythm Baseline wander in lead(s) II III aVF  V5 Confirmed by Erroll Lunani, Rolfe Hartsell Ayokunle [phone]([id number]) on 08/28/2014 11:40:30 PM      MDM   Final diagnoses:  None    Patient presents emergency department for sudden onset palpitations, shortness of breath, weakness. Based on history and physical exam I cannot find an etiology for this. Will obtain electrolytes and pregnancy test for evaluation. EKG is normal, vital signs are normal.  Laboratory studies are normal. Pregnancy is negative. Patient continues to be asymptomatic in the emergency department. She is PERC negative.  She is advised to follow-up with her primary care physician, her vital signs were within her normal limits and she is safe for discharge.    Tomasita CrumbleAdeleke Clerence Gubser, MD 08/29/14 [id number]  Tomasita CrumbleAdeleke Ceaira Ernster, MD 08/29/14 [id number]

## 2014-08-29 NOTE — Discharge Instructions (Signed)
Palpitations Ms. Laughter, you were seen today for your heart racing and dizziness.  Your blood work, vital signs, EKG were all normal.  Follow up with a primary care physician within 3 days for continued treatment. If symptoms worsen come back to emergency department and medially for repeat evaluation. Thank you. A palpitation is the feeling that your heartbeat is irregular. It may feel like your heart is fluttering or skipping a beat. It may also feel like your heart is beating faster than normal. This is usually not a serious problem. In some cases, you may need more medical tests. HOME CARE  Avoid:  Caffeine in coffee, tea, soft drinks, diet pills, and energy drinks.  Chocolate.  Alcohol.  Stop smoking if you smoke.  Reduce your stress and anxiety. Try:  A method that measures bodily functions so you can learn to control them (biofeedback).  Yoga.  Meditation.  Physical activity such as swimming, jogging, or walking.  Get plenty of rest and sleep. GET HELP IF:  Your fast or irregular heartbeat continues after 24 hours.  Your palpitations occur more often. GET HELP RIGHT AWAY IF:   You have chest pain.  You feel short of breath.  You have a very bad headache.  You feel dizzy or pass out (faint). MAKE SURE YOU:   Understand these instructions.  Will watch your condition.  Will get help right away if you are not doing well or get worse. Document Released: 06/07/2008 Document Revised: 01/13/2014 Document Reviewed: 10/28/2011 [facility] Patient Information 2015 [employer], [place]. This information is not intended to replace advice given to you by your health care provider. Make sure you discuss any questions you have with your health care provider.

## 2014-10-14 ENCOUNTER — Encounter (HOSPITAL_COMMUNITY): Payer: Self-pay | Admitting: Emergency Medicine

## 2014-10-14 ENCOUNTER — Emergency Department (HOSPITAL_COMMUNITY)
Admission: EM | Admit: 2014-10-14 | Discharge: 2014-10-15 | Disposition: A | Discharge status: Home / Self Care | Payer: BLUE CROSS/BLUE SHIELD | Attending: Emergency Medicine | Admitting: Emergency Medicine

## 2014-10-14 ENCOUNTER — Emergency Department (HOSPITAL_COMMUNITY): Payer: BLUE CROSS/BLUE SHIELD

## 2014-10-14 DIAGNOSIS — Z792 Long term (current) use of antibiotics: Secondary | ICD-10-CM | POA: Diagnosis not present

## 2014-10-14 DIAGNOSIS — Z3202 Encounter for pregnancy test, result negative: Secondary | ICD-10-CM | POA: Insufficient documentation

## 2014-10-14 DIAGNOSIS — R079 Chest pain, unspecified: Secondary | ICD-10-CM | POA: Diagnosis not present

## 2014-10-14 DIAGNOSIS — R0602 Shortness of breath: Secondary | ICD-10-CM | POA: Insufficient documentation

## 2014-10-14 LAB — BASIC METABOLIC PANEL
ANION GAP: 7 (ref 5–15)
BUN: 12 mg/dL (ref 6–23)
CALCIUM: 8.6 mg/dL (ref 8.4–10.5)
CHLORIDE: 102 mmol/L (ref 96–112)
CO2: 26 mmol/L (ref 19–32)
Creatinine, Ser: 0.7 mg/dL (ref 0.50–1.10)
GFR calc Af Amer: >90 mL/min (ref >90)
GLUCOSE: 88 mg/dL (ref 70–99)
POTASSIUM: 3.8 mmol/L (ref 3.5–5.1)
SODIUM: 135 mmol/L (ref 135–145)

## 2014-10-14 LAB — CBC
HCT: 39.5 % (ref 36.0–46.0)
HEMOGLOBIN: 12.6 g/dL (ref 12.0–15.0)
MCH: 27.8 pg (ref 26.0–34.0)
MCHC: 31.9 g/dL (ref 30.0–36.0)
MCV: 87 fL (ref 78.0–100.0)
PLATELETS: 299 10*3/uL (ref 150–400)
RBC: 4.54 MIL/uL (ref 3.87–5.11)
RDW: 12.5 % (ref 11.5–15.5)
WBC: 5.9 10*3/uL (ref 4.0–10.5)

## 2014-10-14 LAB — I-STAT TROPONIN, ED: TROPONIN I, POC: 0 ng/mL (ref 0.00–0.08)

## 2014-10-14 LAB — D-DIMER, QUANTITATIVE: D-Dimer, Quant: <0.27 ug/mL-FEU (ref 0.00–0.48)

## 2014-10-14 LAB — POC URINE PREG, ED: Preg Test, Ur: NEGATIVE

## 2014-10-14 NOTE — ED Notes (Signed)
Pt states she has been having heaviness in her chest off and on for the past two weeks  Pt states she went to urgent care today and they did an EKG that was abnormal so they wanted her to follow up with cardiology but it could take up to a month to get in or she could come here to have a more in depth evaluation  Pt states she gets this heaviness in her chest with exertion and she feels like it is hard for her to breathe  Pt denies pain at this time

## 2014-10-14 NOTE — ED Provider Notes (Signed)
CSN: [id number]     Arrival date & time 10/14/14  1932 History   First MD Initiated Contact with Patient 10/14/14 2009     Chief Complaint  Patient presents with  . Chest Pain     (Consider location/radiation/quality/duration/timing/severity/associated sxs/prior Treatment) HPI Comments: Patient presents to the ER for evaluation of chest pain and shortness of breath. Patient reports that she first noticed the symptoms about 2 weeks ago. Patient reports that symptoms wax and wane. Pain is primarily on the right side of her chest, at the area of the lower ribs. She has noticed a shortness of breath as well. She has not had any cough or fever. Patient denies injury to the area. No abdominal pain, nausea, vomiting, diarrhea.  Patient is a 24 y.o. female presenting with chest pain.  Chest Pain Associated symptoms: shortness of breath     History reviewed. No pertinent past medical history. History reviewed. No pertinent past surgical history. Family History  Problem Relation Age of Onset  . Hypertension Other   . Diabetes Other    History  Substance Use Topics  . Smoking status: Never Smoker   . Smokeless tobacco: Never Used  . Alcohol Use: No   OB History    No data available     Review of Systems  Respiratory: Positive for shortness of breath.   Cardiovascular: Positive for chest pain.  All other systems reviewed and are negative.     Allergies  Review of patient's allergies indicates no known allergies.  Home Medications   Prior to Admission medications   Medication Sig Start Date End Date Taking? Authorizing Provider  aspirin-acetaminophen-caffeine (EXCEDRIN MIGRAINE) [phone] MG per tablet Take 2 tablets by mouth every 6 (six) hours as needed for headache (headache).    Yes Historical Provider, MD  ciprofloxacin (CIPRO) 500 MG tablet Take 1 tablet (500 mg total) by mouth 2 (two) times daily. Patient not taking: Reported on 08/29/2014 02/15/14   Linus Mako, PA-C   HYDROcodone-acetaminophen (NORCO/VICODIN) 5-325 MG per tablet Take 1-2 tablets by mouth every 4 (four) hours as needed. Patient not taking: Reported on 08/29/2014 02/15/14   Linus Mako, PA-C  ondansetron (ZOFRAN) 4 MG tablet Take 1 tablet (4 mg total) by mouth every 6 (six) hours. Patient not taking: Reported on 08/29/2014 02/15/14   Linus Mako, PA-C   BP 104/49 mmHg  Pulse 77  Temp(Src) 98.3 F (36.8 C) (Oral)  Resp 14  SpO2 100%  LMP 10/02/2014 (Exact Date) Physical Exam  Constitutional: She is oriented to person, place, and time. She appears well-developed and well-nourished. No distress.  HENT:  Head: Normocephalic and atraumatic.  Right Ear: Hearing normal.  Left Ear: Hearing normal.  Nose: Nose normal.  Mouth/Throat: Oropharynx is clear and moist and mucous membranes are normal.  Eyes: Conjunctivae and EOM are normal. Pupils are equal, round, and reactive to light.  Neck: Normal range of motion. Neck supple.  Cardiovascular: Regular rhythm, S1 normal and S2 normal.  Exam reveals no gallop and no friction rub.   No murmur heard. Pulmonary/Chest: Effort normal and breath sounds normal. No respiratory distress. She exhibits no tenderness.  Abdominal: Soft. Normal appearance and bowel sounds are normal. There is no hepatosplenomegaly. There is no tenderness. There is no rebound, no guarding, no tenderness at McBurney's point and negative Murphy's sign. No hernia.  Musculoskeletal: Normal range of motion.  Neurological: She is alert and oriented to person, place, and time. She has normal strength. No cranial  nerve deficit or sensory deficit. Coordination normal. GCS eye subscore is 4. GCS verbal subscore is 5. GCS motor subscore is 6.  Skin: Skin is warm, dry and intact. No rash noted. No cyanosis.  Psychiatric: She has a normal mood and affect. Her speech is normal and behavior is normal. Thought content normal.  Nursing note and vitals reviewed.   ED Course  Procedures  (including critical care time) Labs Review Labs Reviewed  CBC  BASIC METABOLIC PANEL  D-DIMER, QUANTITATIVE  I-STAT [place], ED  POC URINE PREG, ED    Imaging Review Dg Chest 2 View  10/14/2014   CLINICAL DATA:  Subacute onset of intermittent chest heaviness. Initial encounter.  EXAM: CHEST  2 VIEW  COMPARISON:  None.  FINDINGS: The lungs are well-aerated. Mild peribronchial thickening is noted. There is no evidence of focal opacification, pleural effusion or pneumothorax.  The heart is normal in size; the mediastinal contour is within normal limits. No acute osseous abnormalities are seen.  IMPRESSION: Mild peribronchial thickening noted; lungs otherwise clear.   Electronically Signed   By: Garald Balding M.D.   On: 10/14/2014 21:07     EKG Interpretation   Date/Time:  Tuesday October 14 2014 20:58:20 EST Ventricular Rate:  79 PR Interval:  130 QRS Duration: 72 QT Interval:  365 QTC Calculation: 418 R Axis:   71 Text Interpretation:  Sinus rhythm Baseline wander in lead(s) V3 Normal  ECG Confirmed by POLLINA  MD, CHRISTOPHER [phone]) on 10/14/2014 9:13:06 PM      MDM   Final diagnoses:  Shortness of breath   chest pain  Patient presents to the ER for evaluation of chest pain and shortness of breath. Symptoms began ongoing for 2 weeks. Pain is mostly on the right side of her chest. She feels short of breath as well. Patient was seen in urgent care and was told that her EKG was abnormal. EKG here, however, was normal. Troponin was negative. She is BRCA negative and d-dimer was negative. No concern for PE. Chest x-ray shows peribronchial thickening, but no evidence of pneumonia or pneumothorax. Patient reassured, will be treated symptomatically. She is appropriate for discharge.    Orpah Greek, MD 10/15/14 [phone]

## 2014-10-15 MED ORDER — ALBUTEROL SULFATE HFA 108 (90 BASE) MCG/ACT IN AERS: 2.0000 | INHALATION_SPRAY | RESPIRATORY_TRACT | Status: AC | PRN

## 2014-10-15 MED ORDER — HYDROCODONE-ACETAMINOPHEN 5-325 MG PO TABS: 2.0000 | ORAL_TABLET | ORAL | Status: DC | PRN

## 2014-10-15 NOTE — Discharge Instructions (Signed)

## 2014-11-30 ENCOUNTER — Emergency Department (HOSPITAL_COMMUNITY)
Admission: EM | Admit: 2014-11-30 | Discharge: 2014-11-30 | Disposition: A | Discharge status: Home / Self Care | Payer: BLUE CROSS/BLUE SHIELD | Attending: Emergency Medicine | Admitting: Emergency Medicine

## 2014-11-30 ENCOUNTER — Encounter (HOSPITAL_COMMUNITY): Payer: Self-pay | Admitting: *Deleted

## 2014-11-30 DIAGNOSIS — R102 Pelvic and perineal pain: Secondary | ICD-10-CM | POA: Diagnosis present

## 2014-11-30 DIAGNOSIS — N946 Dysmenorrhea, unspecified: Secondary | ICD-10-CM | POA: Diagnosis not present

## 2014-11-30 DIAGNOSIS — R630 Anorexia: Secondary | ICD-10-CM | POA: Diagnosis not present

## 2014-11-30 DIAGNOSIS — Z3202 Encounter for pregnancy test, result negative: Secondary | ICD-10-CM | POA: Insufficient documentation

## 2014-11-30 DIAGNOSIS — Z79899 Other long term (current) drug therapy: Secondary | ICD-10-CM | POA: Insufficient documentation

## 2014-11-30 LAB — CBC WITH DIFFERENTIAL/PLATELET
BASOS ABS: 0 10*3/uL (ref 0.0–0.1)
Basophils Relative: 0 % (ref 0–1)
EOS ABS: 0 10*3/uL (ref 0.0–0.7)
Eosinophils Relative: 0 % (ref 0–5)
HCT: 42.2 % (ref 36.0–46.0)
Hemoglobin: 14 g/dL (ref 12.0–15.0)
LYMPHS ABS: 0.5 10*3/uL — AB (ref 0.7–4.0)
Lymphocytes Relative: 7 % — ABNORMAL LOW (ref 12–46)
MCH: 28.1 pg (ref 26.0–34.0)
MCHC: 33.2 g/dL (ref 30.0–36.0)
MCV: 84.7 fL (ref 78.0–100.0)
MONOS PCT: 8 % (ref 3–12)
Monocytes Absolute: 0.6 10*3/uL (ref 0.1–1.0)
NEUTROS PCT: 85 % — AB (ref 43–77)
Neutro Abs: 6.2 10*3/uL (ref 1.7–7.7)
Platelets: 258 10*3/uL (ref 150–400)
RBC: 4.98 MIL/uL (ref 3.87–5.11)
RDW: 12.1 % (ref 11.5–15.5)
WBC: 7.3 10*3/uL (ref 4.0–10.5)

## 2014-11-30 LAB — URINALYSIS, ROUTINE W REFLEX MICROSCOPIC
Glucose, UA: NEGATIVE mg/dL
Ketones, ur: 40 mg/dL — AB
Leukocytes, UA: NEGATIVE
Nitrite: NEGATIVE
PROTEIN: NEGATIVE mg/dL
Specific Gravity, Urine: 1.036 — ABNORMAL HIGH (ref 1.005–1.030)
Urobilinogen, UA: 1 mg/dL (ref 0.0–1.0)
pH: 5.5 (ref 5.0–8.0)

## 2014-11-30 LAB — COMPREHENSIVE METABOLIC PANEL
ALT: 27 U/L (ref 0–35)
AST: 26 U/L (ref 0–37)
Albumin: 4.2 g/dL (ref 3.5–5.2)
Alkaline Phosphatase: 51 U/L (ref 39–117)
Anion gap: 11 (ref 5–15)
BILIRUBIN TOTAL: 1 mg/dL (ref 0.3–1.2)
BUN: 11 mg/dL (ref 6–23)
CHLORIDE: 107 mmol/L (ref 96–112)
CO2: 21 mmol/L (ref 19–32)
Calcium: 8.9 mg/dL (ref 8.4–10.5)
Creatinine, Ser: 0.69 mg/dL (ref 0.50–1.10)
GFR calc Af Amer: >90 mL/min (ref >90)
GLUCOSE: 107 mg/dL — AB (ref 70–99)
POTASSIUM: 3.8 mmol/L (ref 3.5–5.1)
Sodium: 139 mmol/L (ref 135–145)
Total Protein: 7.7 g/dL (ref 6.0–8.3)

## 2014-11-30 LAB — URINE MICROSCOPIC-ADD ON

## 2014-11-30 LAB — POC URINE PREG, ED: PREG TEST UR: NEGATIVE

## 2014-11-30 MED ORDER — ONDANSETRON HCL 4 MG PO TABS: 4.0000 mg | ORAL_TABLET | Freq: Four times a day (QID) | ORAL | Status: DC

## 2014-11-30 MED ORDER — HYDROCODONE-ACETAMINOPHEN 5-325 MG PO TABS: 2.0000 | ORAL_TABLET | ORAL | Status: DC | PRN

## 2014-11-30 MED ORDER — KETOROLAC TROMETHAMINE 60 MG/2ML IM SOLN
30.0000 mg | Freq: Once | INTRAMUSCULAR | Status: AC
  Administered 2014-11-30: 30 mg via INTRAMUSCULAR
  Filled 2014-11-30: qty 2

## 2014-11-30 MED ORDER — ONDANSETRON 4 MG PO TBDP
4.0000 mg | ORAL_TABLET | Freq: Once | ORAL | Status: AC
  Administered 2014-11-30: 4 mg via ORAL
  Filled 2014-11-30: qty 1

## 2014-11-30 NOTE — ED Provider Notes (Addendum)
CSN: [id number]     Arrival date & time 11/30/14  1145 History   First MD Initiated Contact with Patient 11/30/14 1322     Chief Complaint  Patient presents with  . Abdominal Pain     (Consider location/radiation/quality/duration/timing/severity/associated sxs/prior Treatment) Patient is a 24 y.o. female presenting with abdominal pain. The history is provided by the patient.  Abdominal Pain Pain location: pelvic. Pain quality: aching and cramping   Pain radiates to:  Back Pain severity:  Severe Onset quality:  Gradual Duration:  1 day Timing:  Constant Progression:  Unchanged Chronicity:  Recurrent Context comment:  Started with menses yesterday Relieved by:  Nothing Worsened by:  Nothing tried Ineffective treatments:  NSAIDs Associated symptoms: anorexia, nausea, vaginal bleeding and vomiting   Associated symptoms: no cough, no diarrhea, no dysuria and no fever   Associated symptoms comment:  Pt states saw her pcp on Friday for a thicker d/c without itching or smell and had all std testing done.  Should get results tomorrow.  She saws pain today is like her typical severe menses Risk factors: NSAID use     History reviewed. No pertinent past medical history. History reviewed. No pertinent past surgical history. Family History  Problem Relation Age of Onset  . Hypertension Other   . Diabetes Other    History  Substance Use Topics  . Smoking status: Never Smoker   . Smokeless tobacco: Never Used  . Alcohol Use: No   OB History    No data available     Review of Systems  Constitutional: Negative for fever.  Respiratory: Negative for cough.   Gastrointestinal: Positive for nausea, vomiting, abdominal pain and anorexia. Negative for diarrhea.  Genitourinary: Positive for vaginal bleeding. Negative for dysuria.  All other systems reviewed and are negative.     Allergies  Review of patient's allergies indicates no known allergies.  Home Medications   Prior to  Admission medications   Medication Sig Start Date End Date Taking? Authorizing Provider  albuterol (PROVENTIL HFA;VENTOLIN HFA) 108 (90 BASE) MCG/ACT inhaler Inhale 2 puffs into the lungs every 4 (four) hours as needed for wheezing or shortness of breath. Patient not taking: Reported on 11/30/2014 10/15/14   Gilda Creasehristopher J Pollina, MD  aspirin-acetaminophen-caffeine (EXCEDRIN MIGRAINE) [phone] MG per tablet Take 1-2 tablets by mouth every 6 (six) hours as needed for headache.     Historical Provider, MD  HYDROcodone-acetaminophen (NORCO/VICODIN) 5-325 MG per tablet Take 2 tablets by mouth every 4 (four) hours as needed for moderate pain. Patient not taking: Reported on 11/30/2014 10/15/14   Gilda Creasehristopher J Pollina, MD   BP 107/55 mmHg  Pulse 96  Temp(Src) 97.8 F (36.6 C) (Oral)  Resp 16  SpO2 96%  LMP 11/29/2014 Physical Exam  Constitutional: She is oriented to person, place, and time. She appears well-developed and well-nourished. No distress.  HENT:  Head: Normocephalic and atraumatic.  Mouth/Throat: Oropharynx is clear and moist.  Eyes: Conjunctivae and EOM are normal. Pupils are equal, round, and reactive to light.  Neck: Normal range of motion. Neck supple.  Cardiovascular: Normal rate, regular rhythm and intact distal pulses.   No murmur heard. Pulmonary/Chest: Effort normal and breath sounds normal. No respiratory distress. She has no wheezes. She has no rales.  Abdominal: Soft. She exhibits no distension. There is tenderness in the right lower quadrant, suprapubic area and left lower quadrant. There is no rebound, no guarding and no CVA tenderness.    Musculoskeletal: Normal range of motion. She  exhibits tenderness. She exhibits no edema.       Lumbar back: She exhibits tenderness.       Back:  Neurological: She is alert and oriented to person, place, and time.  Skin: Skin is warm and dry. No rash noted. No erythema.  Psychiatric: She has a normal mood and affect. Her behavior is  normal.  Nursing note and vitals reviewed.   ED Course  Procedures (including critical care time) Labs Review Labs Reviewed  CBC WITH DIFFERENTIAL/PLATELET - Abnormal; Notable for the following:    Neutrophils Relative % 85 (*)    Lymphocytes Relative 7 (*)    Lymphs Abs 0.5 (*)    All other components within normal limits  COMPREHENSIVE METABOLIC PANEL - Abnormal; Notable for the following:    Glucose, Bld 107 (*)    All other components within normal limits  URINALYSIS, ROUTINE W REFLEX MICROSCOPIC - Abnormal; Notable for the following:    Color, Urine AMBER (*)    APPearance CLOUDY (*)    Specific Gravity, Urine 1.036 (*)    Hgb urine dipstick LARGE (*)    Bilirubin Urine SMALL (*)    Ketones, ur 40 (*)    All other components within normal limits  URINE MICROSCOPIC-ADD ON  POC URINE PREG, ED    Imaging Review No results found.   EKG Interpretation None      MDM   Final diagnoses:  Dysmenorrhea    Patient here complaining of severe abdominal cramping with her period. She states this happens every 2 or 3 months. She says recently on Friday 2 days prior to arrival she was seen her PCP office and had STD testing done because she had a thicker discharge the normal but denies any itching, new sexual contacts or fevers. Patient is waiting to get the results back and at this time defers further pelvic exam. Fill today is most likely dysmenorrhea. Also because patient is having this infrequently will have her follow-up with OB/GYN.  After Toradol and Zofran patient is feeling much better was discharged.    Gwyneth Sprout, MD 11/30/14 1544  Gwyneth Sprout, MD 11/30/14 1610

## 2014-11-30 NOTE — ED Notes (Signed)
Pt sts her period started yesterday and she is having severe cramps with nausea and vomiting.

## 2014-11-30 NOTE — Discharge Instructions (Signed)

## 2015-05-04 ENCOUNTER — Encounter (HOSPITAL_COMMUNITY): Payer: Self-pay | Admitting: Emergency Medicine

## 2015-05-04 ENCOUNTER — Emergency Department (HOSPITAL_COMMUNITY)
Admission: EM | Admit: 2015-05-04 | Discharge: 2015-05-05 | Disposition: A | Discharge status: Home / Self Care | Payer: BLUE CROSS/BLUE SHIELD | Attending: Emergency Medicine | Admitting: Emergency Medicine

## 2015-05-04 DIAGNOSIS — R102 Pelvic and perineal pain: Secondary | ICD-10-CM

## 2015-05-04 DIAGNOSIS — N72 Inflammatory disease of cervix uteri: Secondary | ICD-10-CM | POA: Diagnosis not present

## 2015-05-04 DIAGNOSIS — R1030 Lower abdominal pain, unspecified: Secondary | ICD-10-CM | POA: Diagnosis present

## 2015-05-04 DIAGNOSIS — Z3202 Encounter for pregnancy test, result negative: Secondary | ICD-10-CM | POA: Insufficient documentation

## 2015-05-04 LAB — COMPREHENSIVE METABOLIC PANEL
ALBUMIN: 4.1 g/dL (ref 3.5–5.0)
ALT: 18 U/L (ref 14–54)
AST: 21 U/L (ref 15–41)
Alkaline Phosphatase: 51 U/L (ref 38–126)
Anion gap: 5 (ref 5–15)
BUN: 10 mg/dL (ref 6–20)
CHLORIDE: 107 mmol/L (ref 101–111)
CO2: 27 mmol/L (ref 22–32)
Calcium: 9 mg/dL (ref 8.9–10.3)
Creatinine, Ser: 0.71 mg/dL (ref 0.44–1.00)
GFR calc Af Amer: >60 mL/min (ref >60)
GFR calc non Af Amer: >60 mL/min (ref >60)
Glucose, Bld: 95 mg/dL (ref 65–99)
POTASSIUM: 3.3 mmol/L — AB (ref 3.5–5.1)
SODIUM: 139 mmol/L (ref 135–145)
Total Bilirubin: 0.7 mg/dL (ref 0.3–1.2)
Total Protein: 7.3 g/dL (ref 6.5–8.1)

## 2015-05-04 LAB — CBC
HEMATOCRIT: 39.2 % (ref 36.0–46.0)
Hemoglobin: 12.9 g/dL (ref 12.0–15.0)
MCH: 28 pg (ref 26.0–34.0)
MCHC: 32.9 g/dL (ref 30.0–36.0)
MCV: 85 fL (ref 78.0–100.0)
Platelets: 282 10*3/uL (ref 150–400)
RBC: 4.61 MIL/uL (ref 3.87–5.11)
RDW: 12.2 % (ref 11.5–15.5)
WBC: 6.4 10*3/uL (ref 4.0–10.5)

## 2015-05-04 LAB — LIPASE, BLOOD: LIPASE: 19 U/L — AB (ref 22–51)

## 2015-05-04 NOTE — ED Notes (Signed)
Pt A+ox4, reports c/o lower abd pain, low back pain and BLE pain, pt reports "menstrual cramps", being followed by OBGYN for possible cysts.  Pt denies other complaints.  Skin PWD.  MAEI.  Speaking full/clear sentences, rr even/un-lab.  NAD.

## 2015-05-04 NOTE — ED Provider Notes (Signed)
CSN: [id number]     Arrival date & time 05/04/15  1854 History   First MD Initiated Contact with Patient 05/04/15 2359     Chief Complaint  Patient presents with  . Abdominal Pain    menstrual cramps, onset today, being followed for possible cyst  . Back Pain    abd pain radiating into back     (Consider location/radiation/quality/duration/timing/severity/associated sxs/prior Treatment) HPI complains of lower abdominal pain and low back pain onset 5 days ago, constant. Pain is worse with lying supine improved with sitting upright. She started menstrual period yesterday. She denies urinary symptoms. Denies anorexia. Last bowel movement 2 days ago, normal. No nausea or vomiting. No other associated symptoms. Maximum temperature 102 5 days ago.  History reviewed. No pertinent past medical history. past medical history negative History reviewed. No pertinent past surgical history. Family History  Problem Relation Age of Onset  . Hypertension Other   . Diabetes Other    Social History  Substance Use Topics  . Smoking status: Never Smoker   . Smokeless tobacco: Never Used  . Alcohol Use: No   OB History    No data available     Review of Systems  Constitutional: Negative.   HENT: Negative.   Respiratory: Negative.   Cardiovascular: Negative.   Gastrointestinal: Positive for abdominal pain.  Genitourinary: Positive for vaginal bleeding.       Currently on menses  Musculoskeletal: Positive for back pain.  Skin: Negative.   Neurological: Negative.   Psychiatric/Behavioral: Negative.   All other systems reviewed and are negative.     Allergies  Review of patient's allergies indicates no known allergies.  Home Medications   Prior to Admission medications   Medication Sig Start Date End Date Taking? Authorizing Provider  aspirin-acetaminophen-caffeine (EXCEDRIN MIGRAINE) [phone] MG per tablet Take 1-2 tablets by mouth every 6 (six) hours as needed for headache.    Yes  Historical Provider, MD  albuterol (PROVENTIL HFA;VENTOLIN HFA) 108 (90 BASE) MCG/ACT inhaler Inhale 2 puffs into the lungs every 4 (four) hours as needed for wheezing or shortness of breath. Patient not taking: Reported on 11/30/2014 10/15/14   Gilda Crease, MD  HYDROcodone-acetaminophen (NORCO/VICODIN) 5-325 MG per tablet Take 2 tablets by mouth every 4 (four) hours as needed. Patient not taking: Reported on 05/04/2015 11/30/14   Gwyneth Sprout, MD  ondansetron (ZOFRAN) 4 MG tablet Take 1 tablet (4 mg total) by mouth every 6 (six) hours. Patient not taking: Reported on 05/04/2015 11/30/14   Gwyneth Sprout, MD   BP 115/80 mmHg  Pulse 70  Temp(Src) 98.3 F (36.8 C) (Oral)  Resp 20  Ht  (1.499 m)  Wt 118 lb (53.524 kg)  BMI 23.82 kg/m2  SpO2 99%  LMP 05/04/2015 Physical Exam  Constitutional: She appears well-developed and well-nourished. No distress.  HENT:  Head: Normocephalic and atraumatic.  Eyes: Conjunctivae are normal. Pupils are equal, round, and reactive to light.  Neck: Neck supple. No tracheal deviation present. No thyromegaly present.  Cardiovascular: Normal rate and regular rhythm.   No murmur heard. Pulmonary/Chest: Effort normal and breath sounds normal.  Abdominal: Soft. Bowel sounds are normal. She exhibits no distension. There is no tenderness.  Genitourinary:  No flank tenderness. No external lesion. Dark blood in vaginal vault cervical os closed. Positive cervical motion tenderness no adnexal masses or tenderness  Musculoskeletal: Normal range of motion. She exhibits no edema or tenderness.  Neurological: She is alert. Coordination normal.  Skin: Skin is warm  and dry. No rash noted.  Psychiatric: She has a normal mood and affect.  Nursing note and vitals reviewed.   ED Course  Procedures (including critical care time) Labs Review Labs Reviewed  LIPASE, BLOOD - Abnormal; Notable for the following:    Lipase 19 (*)    All other components within  normal limits  COMPREHENSIVE METABOLIC PANEL - Abnormal; Notable for the following:    Potassium 3.3 (*)    All other components within normal limits  CBC  URINALYSIS, ROUTINE W REFLEX MICROSCOPIC (NOT AT [facility])  URINALYSIS, ROUTINE W REFLEX MICROSCOPIC (NOT AT [facility])  POC URINE PREG, ED  I-STAT BETA HCG BLOOD, ED ([facility], [facility], [facility] ONLY)    Imaging Review No results found. I have personally reviewed and evaluated these images and lab results as part of my medical decision-making.   EKG Interpretation None     2:10 AM patient feels much improved after treatment with intravenous morphine Results for orders placed or performed during the hospital encounter of 05/04/15  Wet prep, genital  Result Value Ref Range   Yeast Wet Prep HPF POC NONE SEEN NONE SEEN   Trich, Wet Prep NONE SEEN NONE SEEN   Clue Cells Wet Prep HPF POC NONE SEEN NONE SEEN   WBC, Wet Prep HPF POC RARE (A) NONE SEEN  Lipase, blood  Result Value Ref Range   Lipase 19 (L) 22 - 51 U/L  Comprehensive metabolic panel  Result Value Ref Range   Sodium 139 135 - 145 mmol/L   Potassium 3.3 (L) 3.5 - 5.1 mmol/L   Chloride 107 101 - 111 mmol/L   CO2 27 22 - 32 mmol/L   Glucose, Bld 95 65 - 99 mg/dL   BUN 10 6 - 20 mg/dL   Creatinine, Ser 1.61 0.44 - 1.00 mg/dL   Calcium 9.0 8.9 - 09.6 mg/dL   Total Protein 7.3 6.5 - 8.1 g/dL   Albumin 4.1 3.5 - 5.0 g/dL   AST 21 15 - 41 U/L   ALT 18 14 - 54 U/L   Alkaline Phosphatase 51 38 - 126 U/L   Total Bilirubin 0.7 0.3 - 1.2 mg/dL   GFR calc non Af Amer >60 >60 mL/min   GFR calc Af Amer >60 >60 mL/min   Anion gap 5 5 - 15  CBC  Result Value Ref Range   WBC 6.4 4.0 - 10.5 K/uL   RBC 4.61 3.87 - 5.11 MIL/uL   Hemoglobin 12.9 12.0 - 15.0 g/dL   HCT 04.5 40.9 - 81.1 %   MCV 85.0 78.0 - 100.0 fL   MCH 28.0 26.0 - 34.0 pg   MCHC 32.9 30.0 - 36.0 g/dL   RDW 91.4 78.2 - 95.6 %   Platelets 282 150 - 400 K/uL  Urinalysis, Routine w reflex microscopic (not at [facility])  Result Value Ref  Range   Color, Urine YELLOW YELLOW   APPearance TURBID (A) CLEAR   Specific Gravity, Urine 1.025 1.005 - 1.030   pH 7.5 5.0 - 8.0   Glucose, UA NEGATIVE NEGATIVE mg/dL   Hgb urine dipstick MODERATE (A) NEGATIVE   Bilirubin Urine NEGATIVE NEGATIVE   Ketones, ur NEGATIVE NEGATIVE mg/dL   Protein, ur NEGATIVE NEGATIVE mg/dL   Urobilinogen, UA 1.0 0.0 - 1.0 mg/dL   Nitrite NEGATIVE NEGATIVE   Leukocytes, UA NEGATIVE NEGATIVE  Urine microscopic-add on  Result Value Ref Range   Squamous Epithelial / LPF RARE RARE   WBC, UA 0-2 <3 WBC/hpf  RBC / HPF 7-10 <3 RBC/hpf   Bacteria, UA RARE RARE   Urine-Other AMORPHOUS URATES/PHOSPHATES   POC urine preg, ED (not at [facility])  Result Value Ref Range   Preg Test, Ur NEGATIVE NEGATIVE   US Transvaginal Non-ob  05/05/2015   CLINICAL DATA:  Pelvic and back pain for 1 week  EXAM: TRANSABDOMINAL AND TRANSVAGINAL ULTRASOUND OF PELVIS  TECHNIQUE: Both transabdominal and transvaginal ultrasound examinations of the pelvis were performed. Transabdominal technique was performed for global imaging of the pelvis including uterus, ovaries, adnexal regions, and pelvic cul-de-sac. It was necessary to proceed with endovaginal exam following the transabdominal exam to visualize the ovaries and endometrium.  COMPARISON:  None  FINDINGS: Uterus  Measurements: 6.7 x 3 x 4.3 cm. Anteverted. No fibroids or other mass visualized.  Endometrium  Thickness: 8 mm.  No focal abnormality visualized.  Right ovary  Measurements: 2.6 x 1.8 x 2.6 cm. Normal appearance/no adnexal mass.  Left ovary  Measurements: 2.8 x 1.8 x 2 cm. Normal appearance/no adnexal mass.  Other findings  Minimal free fluid in the pelvis.  IMPRESSION: Normal ultrasound appearance of the uterus and ovaries.   Electronically Signed   By: Burman Nieves M.D.   On: 05/05/2015 01:58   US Pelvis Complete  05/05/2015   CLINICAL DATA:  Pelvic and back pain for 1 week  EXAM: TRANSABDOMINAL AND TRANSVAGINAL ULTRASOUND OF  PELVIS  TECHNIQUE: Both transabdominal and transvaginal ultrasound examinations of the pelvis were performed. Transabdominal technique was performed for global imaging of the pelvis including uterus, ovaries, adnexal regions, and pelvic cul-de-sac. It was necessary to proceed with endovaginal exam following the transabdominal exam to visualize the ovaries and endometrium.  COMPARISON:  None  FINDINGS: Uterus  Measurements: 6.7 x 3 x 4.3 cm. Anteverted. No fibroids or other mass visualized.  Endometrium  Thickness: 8 mm.  No focal abnormality visualized.  Right ovary  Measurements: 2.6 x 1.8 x 2.6 cm. Normal appearance/no adnexal mass.  Left ovary  Measurements: 2.8 x 1.8 x 2 cm. Normal appearance/no adnexal mass.  Other findings  Minimal free fluid in the pelvis.  IMPRESSION: Normal ultrasound appearance of the uterus and ovaries.   Electronically Signed   By: Burman Nieves M.D.   On: 05/05/2015 01:58    MDM  Medically patient has cervicitis we'll treat empirically with Rocephin, Zithromax. Prescription Norco. Referral back to primary care physician for gynecology referral Final diagnoses:  None   diagnosis #1cervicitis #2 hypokalemia    Doug Sou, MD 05/05/15 9528

## 2015-05-05 ENCOUNTER — Emergency Department (HOSPITAL_COMMUNITY): Payer: BLUE CROSS/BLUE SHIELD

## 2015-05-05 LAB — URINALYSIS, ROUTINE W REFLEX MICROSCOPIC
Bilirubin Urine: NEGATIVE
Glucose, UA: NEGATIVE mg/dL
Ketones, ur: NEGATIVE mg/dL
Leukocytes, UA: NEGATIVE
Nitrite: NEGATIVE
Protein, ur: NEGATIVE mg/dL
SPECIFIC GRAVITY, URINE: 1.025 (ref 1.005–1.030)
UROBILINOGEN UA: 1 mg/dL (ref 0.0–1.0)
pH: 7.5 (ref 5.0–8.0)

## 2015-05-05 LAB — WET PREP, GENITAL
Clue Cells Wet Prep HPF POC: NONE SEEN
Trich, Wet Prep: NONE SEEN
Yeast Wet Prep HPF POC: NONE SEEN

## 2015-05-05 LAB — URINE MICROSCOPIC-ADD ON

## 2015-05-05 LAB — RPR: RPR: NONREACTIVE

## 2015-05-05 LAB — HIV ANTIBODY (ROUTINE TESTING W REFLEX): HIV SCREEN 4TH GENERATION: NONREACTIVE

## 2015-05-05 LAB — GC/CHLAMYDIA PROBE AMP (~~LOC~~) NOT AT ARMC
CHLAMYDIA, DNA PROBE: NEGATIVE
NEISSERIA GONORRHEA: NEGATIVE

## 2015-05-05 LAB — POC URINE PREG, ED: Preg Test, Ur: NEGATIVE

## 2015-05-05 MED ORDER — LIDOCAINE HCL (PF) 1 % IJ SOLN
INTRAMUSCULAR | Status: AC
  Administered 2015-05-05: 0.9 mL
  Filled 2015-05-05: qty 5

## 2015-05-05 MED ORDER — AZITHROMYCIN 250 MG PO TABS
1000.0000 mg | ORAL_TABLET | Freq: Once | ORAL | Status: AC
  Administered 2015-05-05: 1000 mg via ORAL
  Filled 2015-05-05: qty 4

## 2015-05-05 MED ORDER — POTASSIUM CHLORIDE CRYS ER 20 MEQ PO TBCR
40.0000 meq | EXTENDED_RELEASE_TABLET | Freq: Once | ORAL | Status: AC
  Administered 2015-05-05: 40 meq via ORAL
  Filled 2015-05-05: qty 2

## 2015-05-05 MED ORDER — HYDROCODONE-ACETAMINOPHEN 5-325 MG PO TABS: 1.0000 | ORAL_TABLET | Freq: Four times a day (QID) | ORAL | Status: DC | PRN

## 2015-05-05 MED ORDER — CEFTRIAXONE SODIUM 250 MG IJ SOLR
250.0000 mg | Freq: Once | INTRAMUSCULAR | Status: AC
  Administered 2015-05-05: 250 mg via INTRAMUSCULAR
  Filled 2015-05-05: qty 250

## 2015-05-05 MED ORDER — MORPHINE SULFATE (PF) 4 MG/ML IV SOLN
4.0000 mg | Freq: Once | INTRAVENOUS | Status: AC
  Administered 2015-05-05: 4 mg via INTRAVENOUS
  Filled 2015-05-05: qty 1

## 2015-05-05 NOTE — ED Notes (Signed)
Patient transported to Ultrasound 

## 2015-05-05 NOTE — ED Notes (Signed)
Pt c/o lower back pain x "just under a week" and lower abdominal pain starting today.  Denies abnormal vaginal discharge.  Pt reports that she is on her period. Pain score 8/10.

## 2015-05-05 NOTE — ED Notes (Signed)
Verbalized understanding discharge instructions and follow-up. In no acute distress.   

## 2015-05-05 NOTE — Discharge Instructions (Signed)
Take Advil for mild pain or the pain medicine prescribed for bad pain. Contact your primary care physician for referral to a gynecologist to arrange to be seen within the next one or 2 weeks. Return if your condition worsens for any reason.If you are sexually active, uses a condom each time that you have sex in order to prevent sexually transmitted diseases.

## 2015-10-12 ENCOUNTER — Emergency Department (HOSPITAL_COMMUNITY)
Admission: EM | Admit: 2015-10-12 | Discharge: 2015-10-13 | Disposition: A | Discharge status: Home / Self Care | Payer: BLUE CROSS/BLUE SHIELD | Attending: Emergency Medicine | Admitting: Emergency Medicine

## 2015-10-12 ENCOUNTER — Emergency Department (HOSPITAL_COMMUNITY): Payer: BLUE CROSS/BLUE SHIELD

## 2015-10-12 ENCOUNTER — Encounter (HOSPITAL_COMMUNITY): Payer: Self-pay | Admitting: Emergency Medicine

## 2015-10-12 DIAGNOSIS — R42 Dizziness and giddiness: Secondary | ICD-10-CM | POA: Insufficient documentation

## 2015-10-12 DIAGNOSIS — Z3202 Encounter for pregnancy test, result negative: Secondary | ICD-10-CM | POA: Insufficient documentation

## 2015-10-12 DIAGNOSIS — R079 Chest pain, unspecified: Secondary | ICD-10-CM | POA: Diagnosis present

## 2015-10-12 DIAGNOSIS — K219 Gastro-esophageal reflux disease without esophagitis: Secondary | ICD-10-CM

## 2015-10-12 DIAGNOSIS — Z79899 Other long term (current) drug therapy: Secondary | ICD-10-CM | POA: Insufficient documentation

## 2015-10-12 LAB — CBC
HEMATOCRIT: 39.1 % (ref 36.0–46.0)
Hemoglobin: 12.6 g/dL (ref 12.0–15.0)
MCH: 28 pg (ref 26.0–34.0)
MCHC: 32.2 g/dL (ref 30.0–36.0)
MCV: 86.9 fL (ref 78.0–100.0)
PLATELETS: 283 10*3/uL (ref 150–400)
RBC: 4.5 MIL/uL (ref 3.87–5.11)
RDW: 12.9 % (ref 11.5–15.5)
WBC: 4.3 10*3/uL (ref 4.0–10.5)

## 2015-10-12 LAB — BASIC METABOLIC PANEL
Anion gap: 7 (ref 5–15)
BUN: 10 mg/dL (ref 6–20)
CHLORIDE: 105 mmol/L (ref 101–111)
CO2: 26 mmol/L (ref 22–32)
CREATININE: 0.76 mg/dL (ref 0.44–1.00)
Calcium: 8.9 mg/dL (ref 8.9–10.3)
Glucose, Bld: 92 mg/dL (ref 65–99)
POTASSIUM: 3.8 mmol/L (ref 3.5–5.1)
SODIUM: 138 mmol/L (ref 135–145)

## 2015-10-12 LAB — POC URINE PREG, ED: Preg Test, Ur: NEGATIVE

## 2015-10-12 MED ORDER — GI COCKTAIL ~~LOC~~
30.0000 mL | Freq: Once | ORAL | Status: AC
  Administered 2015-10-12: 30 mL via ORAL
  Filled 2015-10-12: qty 30

## 2015-10-12 NOTE — ED Provider Notes (Signed)
CSN: [id number]     Arrival date & time 10/12/15  2052 History  By signing my name below, I, [name], attest that this documentation has been prepared under the direction and in the presence of Azalia Bilis, MD. Electronically Signed: [name], ED Scribe. 10/12/2015. 12:28 AM.   Chief Complaint  Patient presents with  . Chest Pain  . Dizziness   The history is provided by the patient. No language interpreter was used.    HPI Comments: Megan Sharp is a 24 y.o. 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 She states her chest first felt "tight, then painful," and her pain persisted throughout the whole day. When asked if her breathing has been unusual, she notes her breathing was "gasp-y" throughout today. Patient states she then tried to drink water and tried to wait out her symptoms with no relief. She states later in the day, she started to feel lightheaded. Patient states she feels some chest tightness presently but her other symptoms seem to have resolved. Patient states she has been in her usual state of health in the past several weeks, with no unusual weight loss or gain. Patient denies a history of any known personal or family chronic medical conditions. She denies nausea, vomiting, fever, leg swelling, hematochezia, melena, dysuria, urinary frequency, hematuria, or any other symptoms.  History reviewed. No pertinent past medical history. History reviewed. No pertinent past surgical history. Family History  Problem Relation Age of Onset  . Hypertension Other   . Diabetes Other    Social History  Substance Use Topics  . Smoking status: Never Smoker   . Smokeless tobacco: Never Used  . Alcohol Use: No   OB History    No data available     Review of Systems A complete 10 system review of systems was obtained and all systems are negative except as  noted in the HPI and PMH.    Allergies  Review of patient's allergies indicates no known allergies.  Home Medications   Prior to Admission medications   Medication Sig Start Date End Date Taking? Authorizing Provider  aspirin-acetaminophen-caffeine (EXCEDRIN MIGRAINE) [phone] MG per tablet Take 1-2 tablets by mouth every 6 (six) hours as needed for headache.    Yes Historical Provider, MD  Multiple Vitamin (MULTIVITAMIN WITH MINERALS) TABS tablet Take 1 tablet by mouth daily.   Yes Historical Provider, MD  albuterol (PROVENTIL HFA;VENTOLIN HFA) 108 (90 BASE) MCG/ACT inhaler Inhale 2 puffs into the lungs every 4 (four) hours as needed for wheezing or shortness of breath. Patient not taking: Reported on 11/30/2014 10/15/14   Gilda Crease, MD  HYDROcodone-acetaminophen (NORCO) 5-325 MG per tablet Take 1 tablet by mouth every 6 (six) hours as needed for severe pain. Patient not taking: Reported on 10/12/2015 05/05/15   Doug Sou, MD  ondansetron (ZOFRAN) 4 MG tablet Take 1 tablet (4 mg total) by mouth every 6 (six) hours. Patient not taking: Reported on 05/04/2015 11/30/14   Gwyneth Sprout, MD   BP 108/74 mmHg  Pulse 72  Temp(Src) 98.7 F (37.1 C) (Oral)  Resp 12  SpO2 100%  LMP 10/04/2015 Physical Exam  Constitutional: She is oriented to person, place, and time. She appears well-developed and well-nourished. No distress.  HENT:  Head: Normocephalic and atraumatic.  Eyes: EOM are normal.  Neck: Normal range of motion.  Cardiovascular: Normal rate, regular rhythm and normal heart  sounds.   Pulmonary/Chest: Effort normal and breath sounds normal.  Lungs are clear to auscultation bilaterally.  Abdominal: Soft. Bowel sounds are normal. She exhibits no distension and no mass. There is no tenderness. There is no rebound and no guarding.  Musculoskeletal: Normal range of motion.  Neurological: She is alert and oriented to person, place, and time.  Skin: Skin is warm and dry.   Psychiatric: She has a normal mood and affect. Judgment normal.  Nursing note and vitals reviewed.   ED Course  Procedures (including critical care time)  DIAGNOSTIC STUDIES: Oxygen Saturation is 100% on RA, normal by my interpretation.    COORDINATION OF CARE: 11:30 PM - Lab results, imaging results, and EKG results discussed with pt. Discussed treatment plan with pt at bedside which includes GI cocktail and re-evaluation. Pt verbalized understanding and agreed to plan.   12:27 AM - Pt reports resolution of symptoms following GI cocktail.  Labs Review Labs Reviewed  BASIC METABOLIC PANEL  CBC  POC URINE PREG, ED    Imaging Review Dg Chest 2 View  10/12/2015  CLINICAL DATA:  Chest tightness with shortness of breath today. EXAM: CHEST  2 VIEW COMPARISON:  Radiographs 10/14/2014. FINDINGS: The heart size and mediastinal contours are normal. The lungs are clear. There is no pleural effusion or pneumothorax. No acute osseous findings are identified. IMPRESSION: Stable chest.  No active cardiopulmonary process. Electronically Signed   By: Carey Bullocks M.D.   On: 10/12/2015 21:24   I have personally reviewed and evaluated these images and lab results as part of my medical decision-making.   EKG Interpretation   Date/Time:  Monday October 12 2015 21:07:06 EST Ventricular Rate:  78 PR Interval:  128 QRS Duration: 71 QT Interval:  349 QTC Calculation: 397 R Axis:   74 Text Interpretation:  Sinus rhythm Baseline wander in lead(s) V4 V5 No  significant change was found Confirmed by Sharma Lawrance  MD, Kajal Scalici ([id number]) on  10/12/2015 11:06:58 PM      MDM   Final diagnoses:  None    Well appearing. Suspect GERD. Doubt ACS. Doubt PE  I personally performed the services described in this documentation, which was scribed in my presence. The recorded information has been reviewed and is accurate.         Azalia Bilis, MD 10/13/15 [id number]

## 2015-10-12 NOTE — ED Notes (Signed)
Patient c/o left sided chest tightness radiating underneath left breast and lightheadedness. Denies N/V, dizziness, back pain, arm pain.

## 2015-10-13 MED ORDER — OMEPRAZOLE 20 MG PO CPDR: 20.0000 mg | DELAYED_RELEASE_CAPSULE | Freq: Every day | ORAL | Status: AC

## 2016-05-30 IMAGING — US US TRANSVAGINAL NON-OB
1 series · 14 of 25 positions shown · non-contrast
Comparison: None

CLINICAL DATA: Pelvic and back pain for 1 week

EXAM:
TRANSABDOMINAL AND TRANSVAGINAL ULTRASOUND OF PELVIS
TECHNIQUE: Both transabdominal and transvaginal ultrasound examinations of the
pelvis were performed. Transabdominal technique was performed for
global imaging of the pelvis including uterus, ovaries, adnexal
regions, and pelvic cul-de-sac. It was necessary to proceed with
endovaginal exam following the transabdominal exam to visualize the
ovaries and endometrium.

[Series 1: us transvaginal non-ob · 0.17mm/px · 14 of 43 slices shown]
[im 1/43]
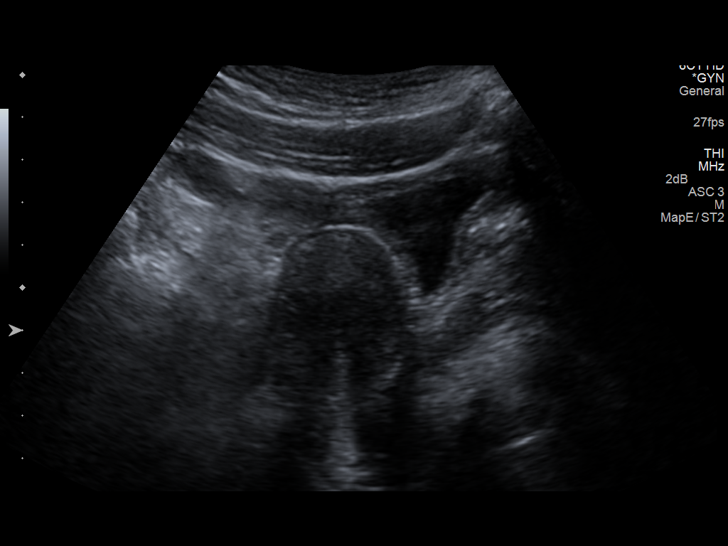
[im 4/43]
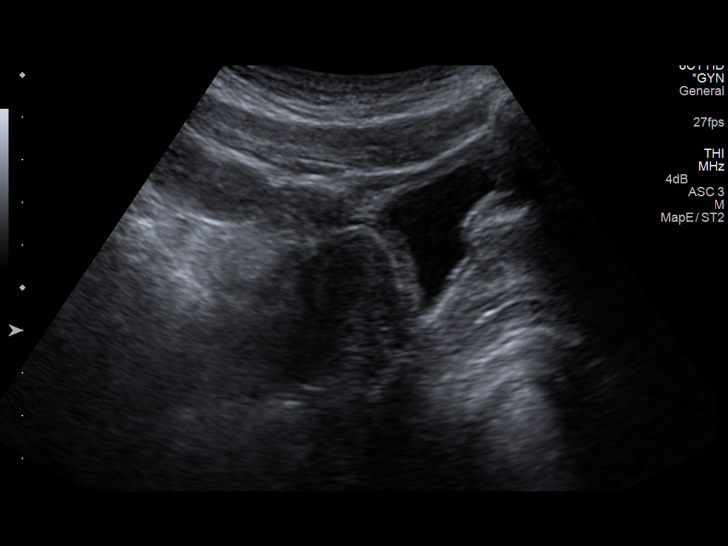
[im 8/43]
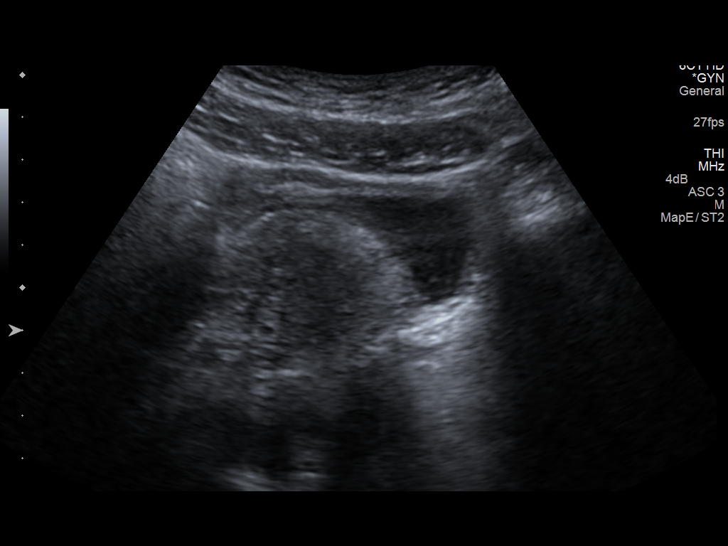
[im 11/43]
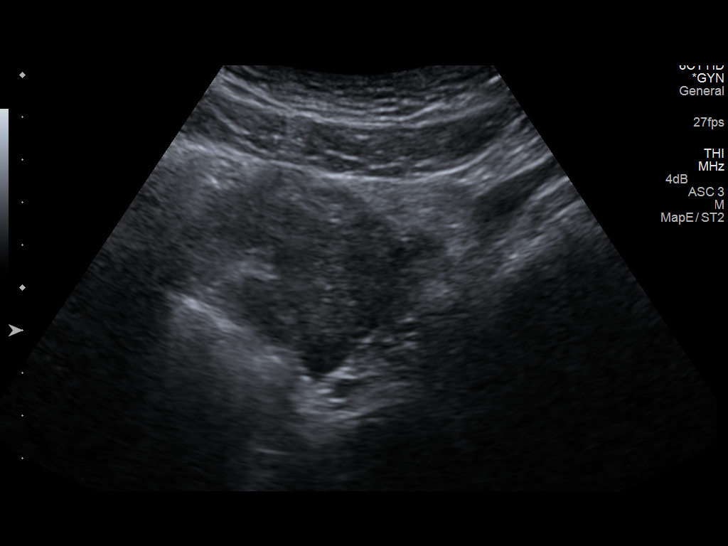
[im 15/43]
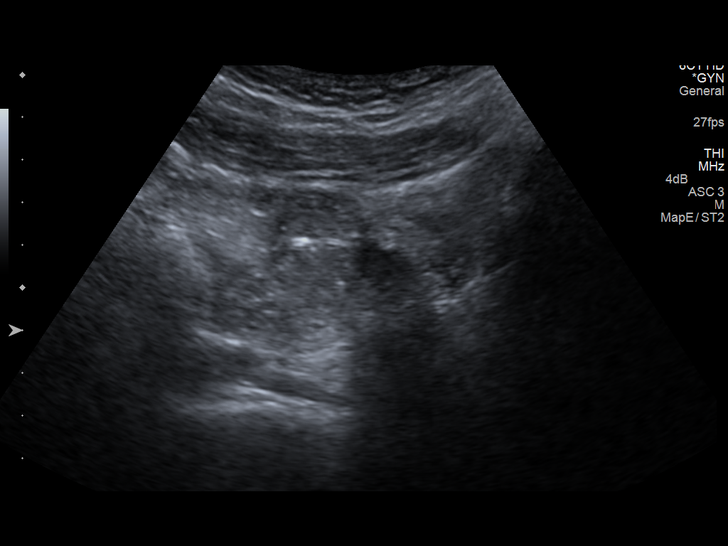
[im 16/43]
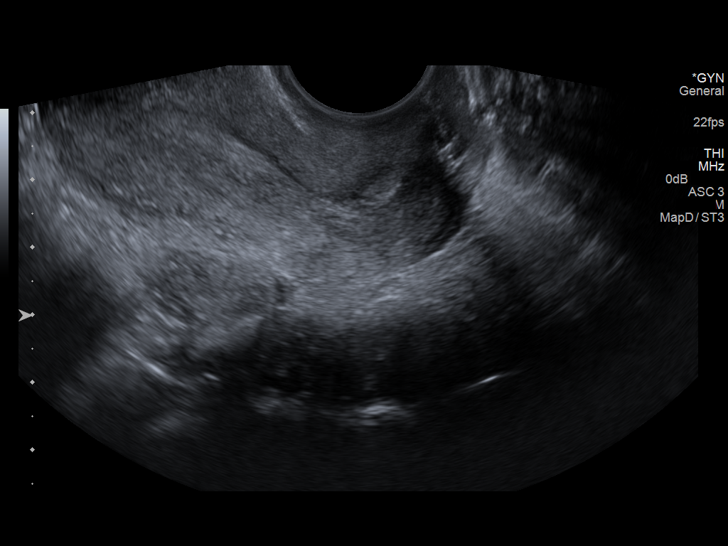
[im 20/43]
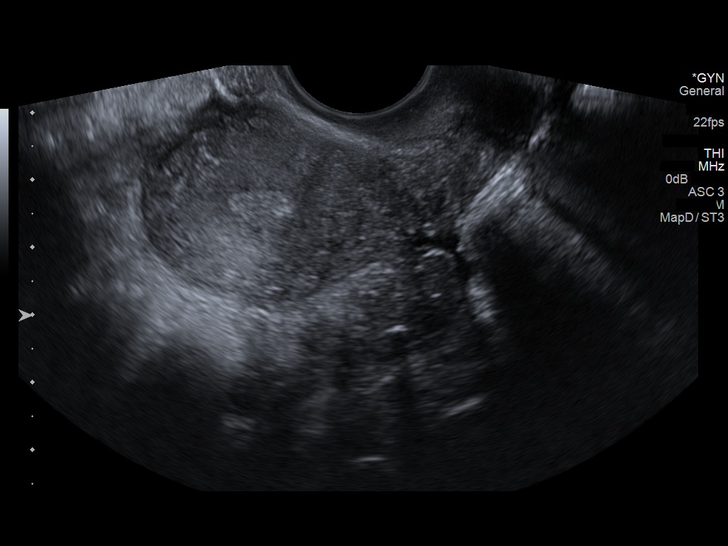
[im 23/43]
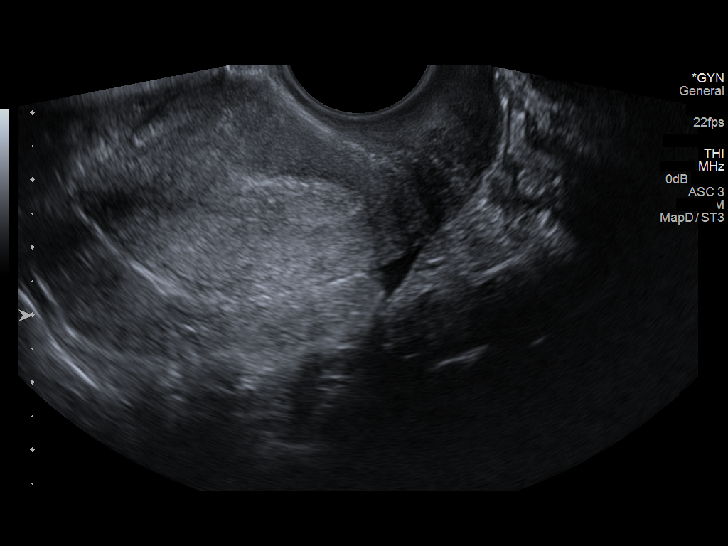
[im 27/43]
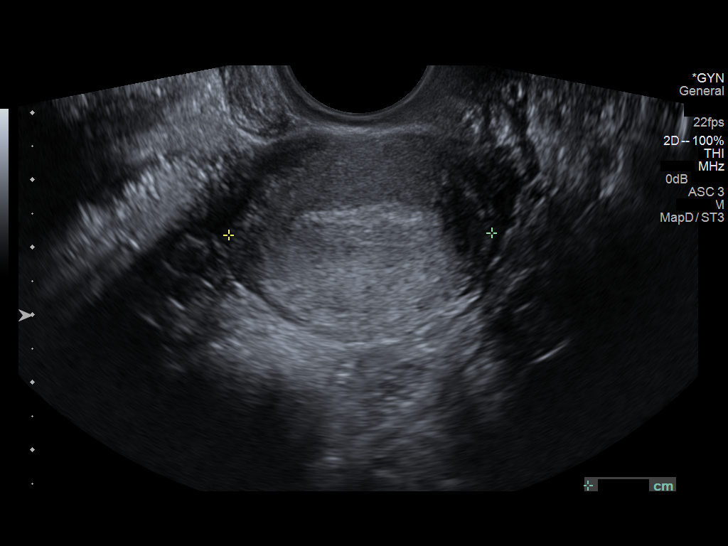
[im 29/43]
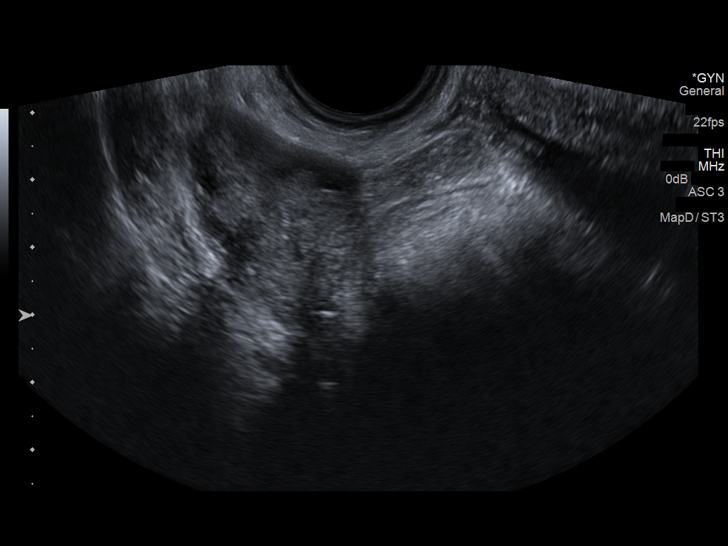
[im 32/43]
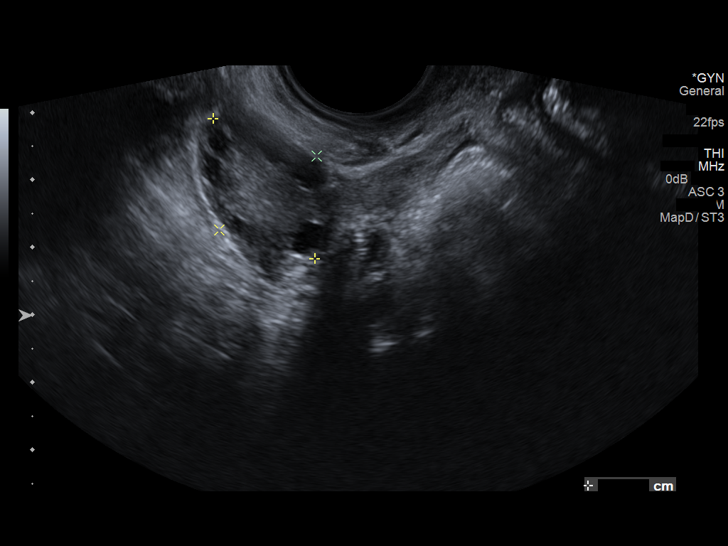
[im 36/43]
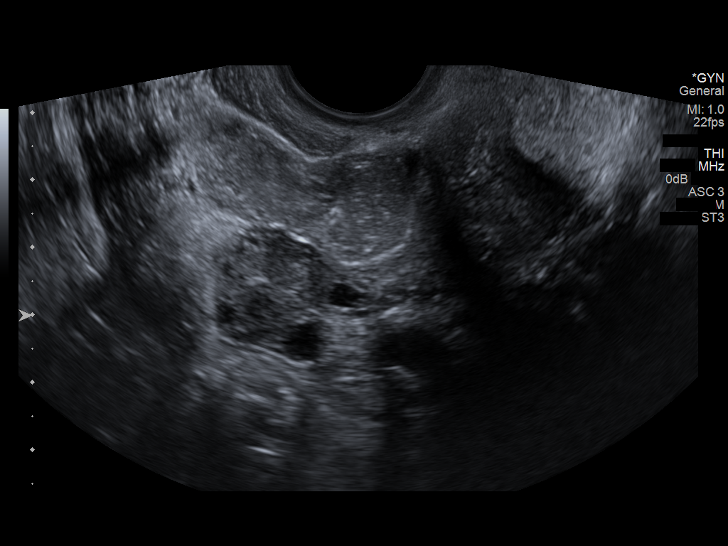
[im 39/43]
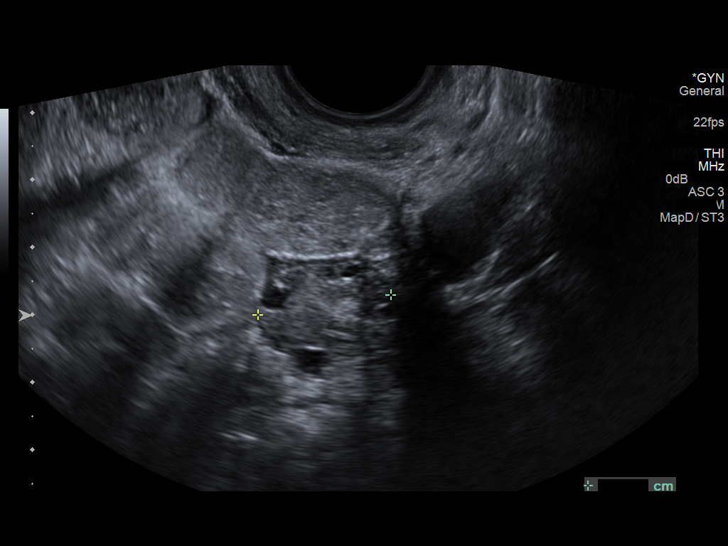
[im 43/43]
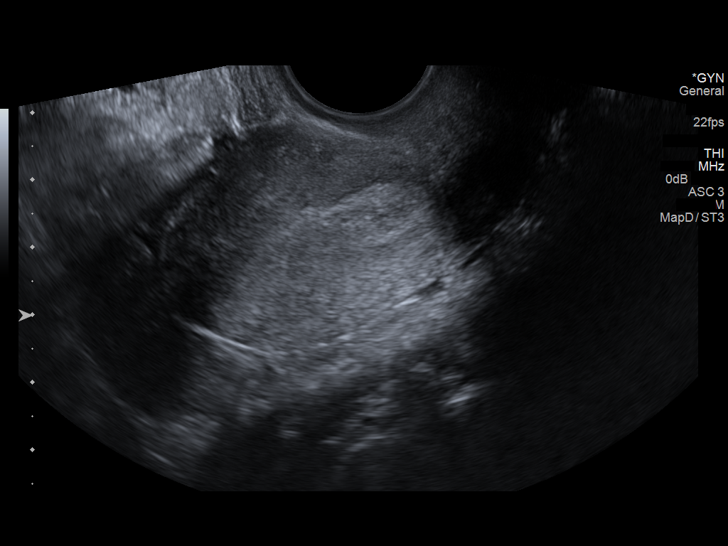

[14 of 25 positions shown; findings below may reference images not displayed]

FINDINGS: Uterus

Measurements: 6.7 x 3 x 4.3 cm. Anteverted. No fibroids or other
mass visualized.

Endometrium

Thickness: 8 mm.  No focal abnormality visualized.

Right ovary

Measurements: 2.6 x 1.8 x 2.6 cm. Normal appearance/no adnexal mass.

Left ovary

Measurements: 2.8 x 1.8 x 2 cm. Normal appearance/no adnexal mass.

Other findings

Minimal free fluid in the pelvis.
IMPRESSION: Normal ultrasound appearance of the uterus and ovaries.

## 2016-12-04 IMAGING — CR DG CHEST 2V
2 series · 2 of 2 positions shown · non-contrast
Comparison: Radiographs 10/14/2014.

CLINICAL DATA: Chest tightness with shortness of breath today.

EXAM:
CHEST  2 VIEW

[w chest pa]
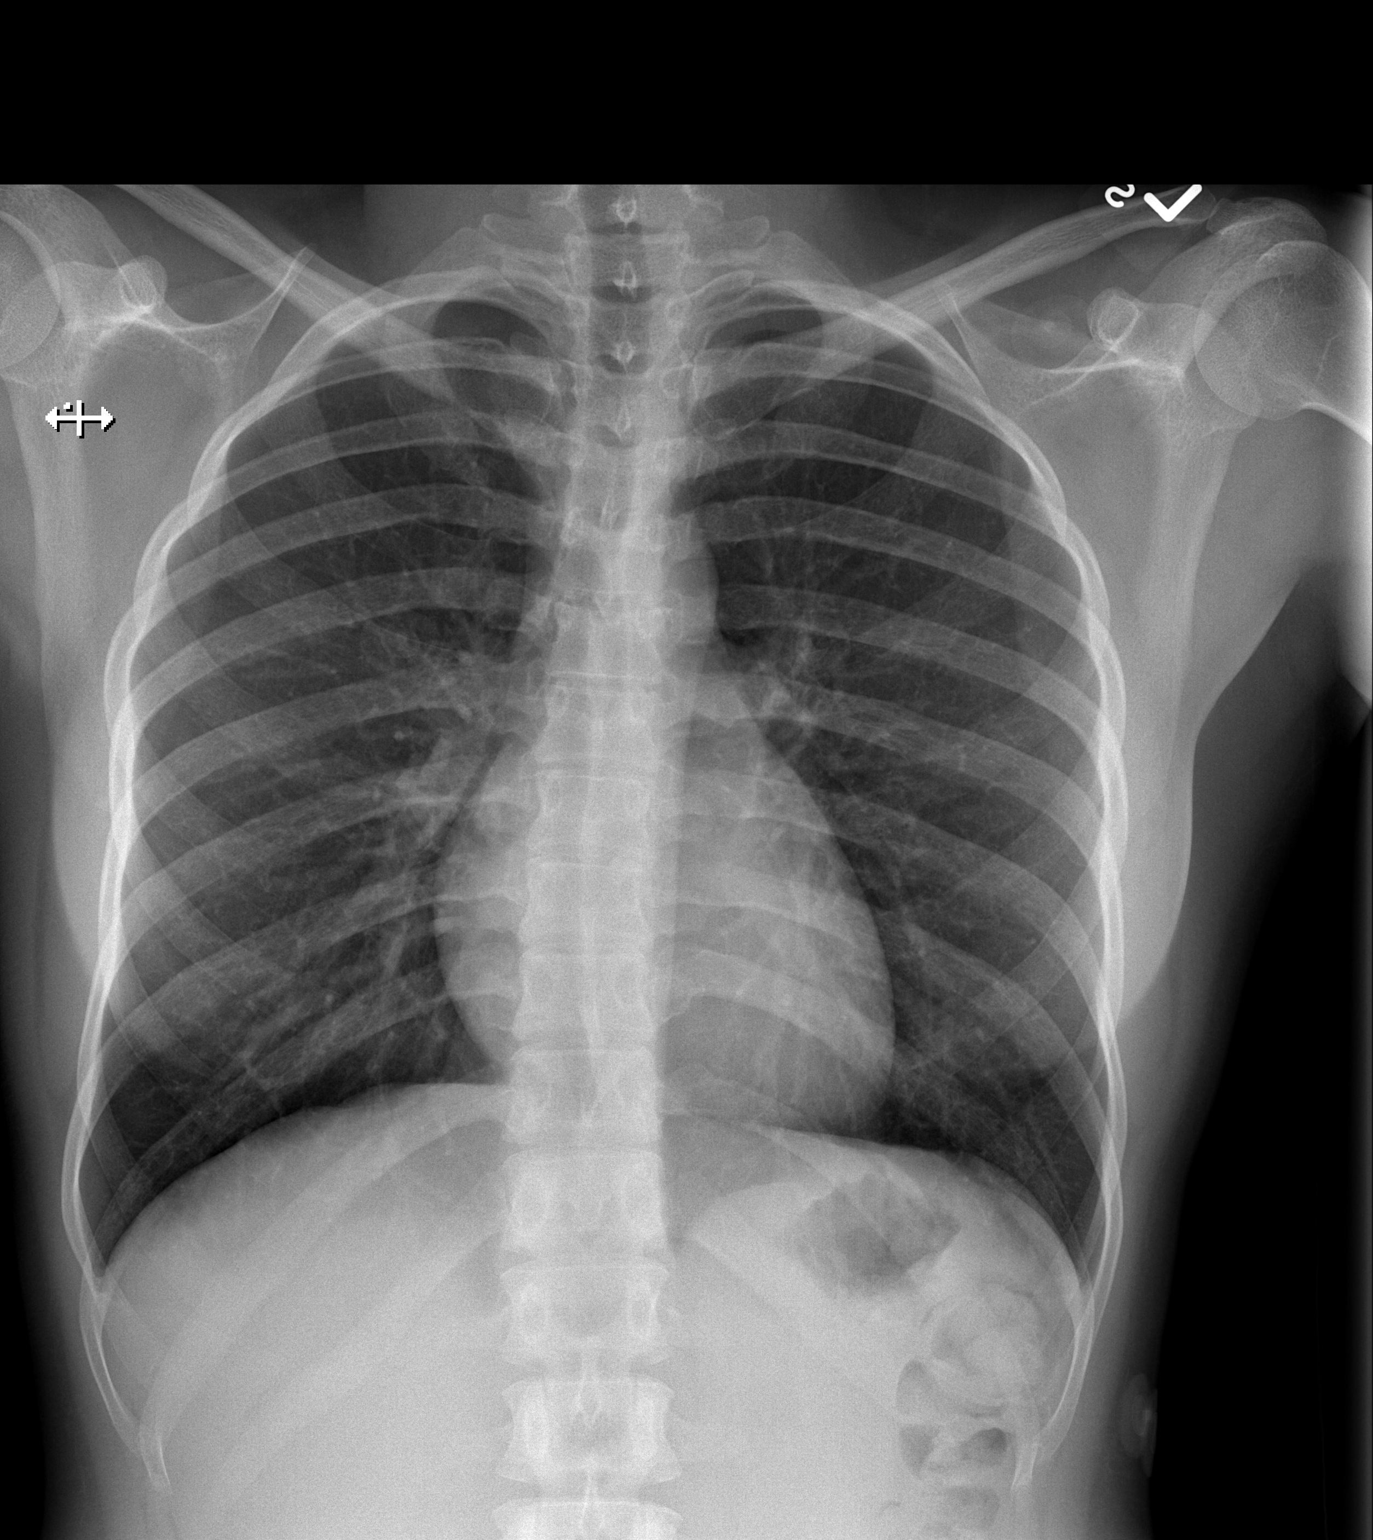

[w chest lat]
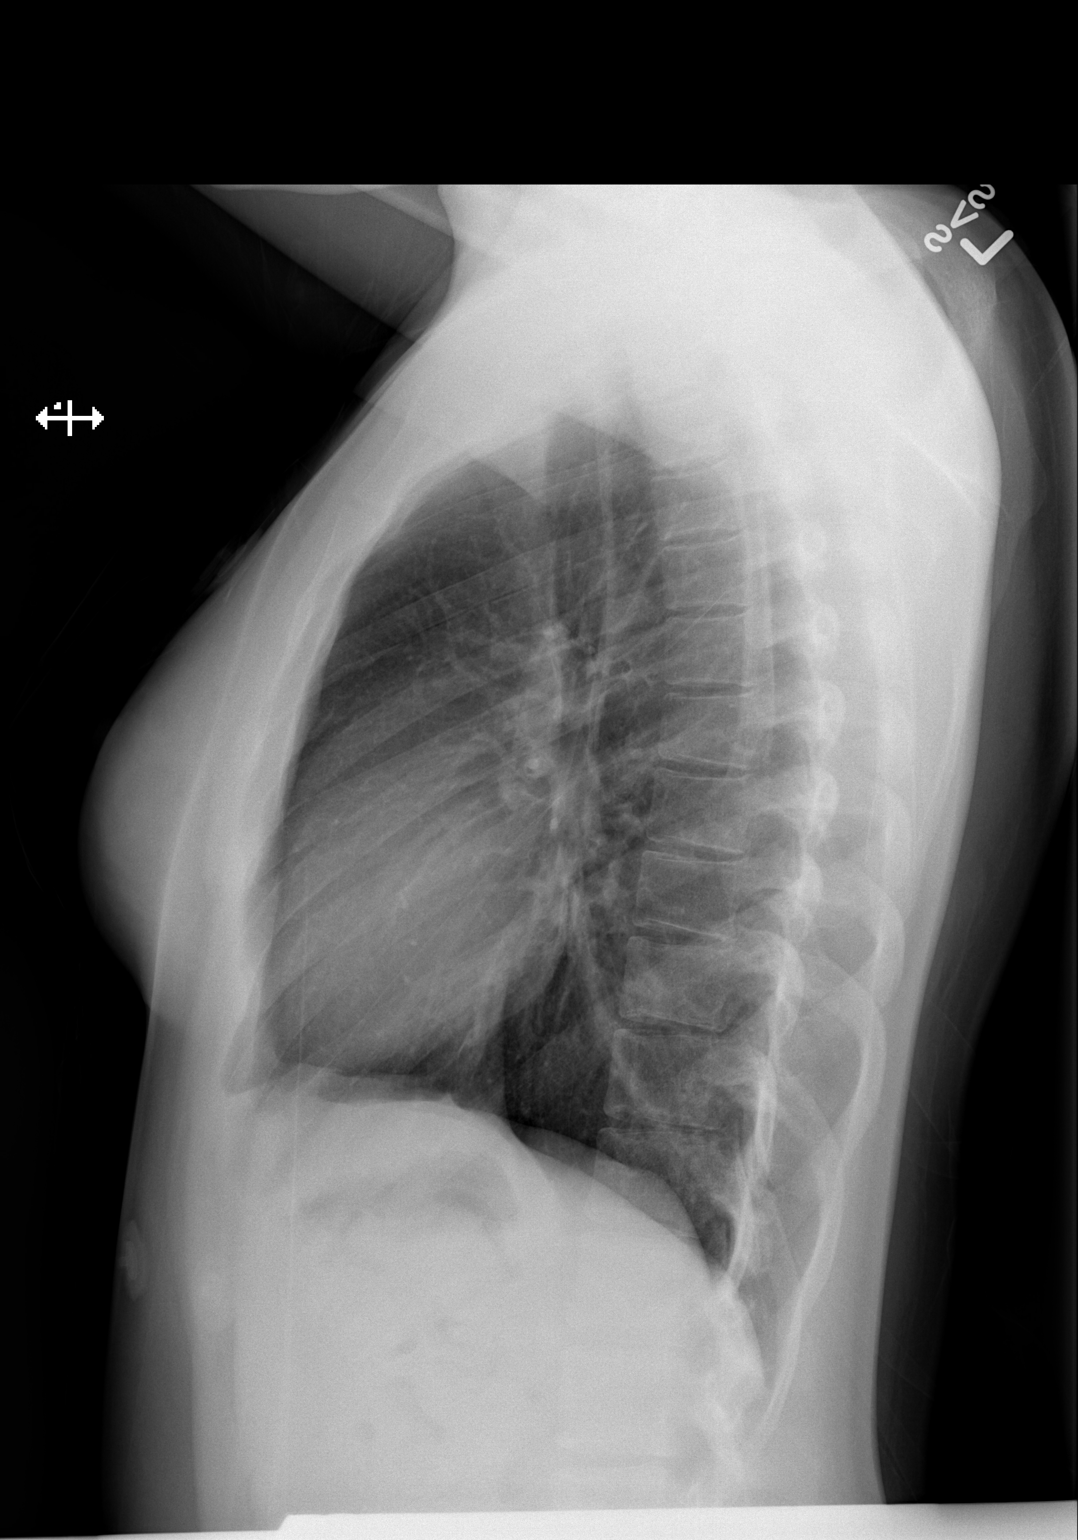

[2 of 2 positions shown; findings below may reference images not displayed]

FINDINGS: The heart size and mediastinal contours are normal. The lungs are
clear. There is no pleural effusion or pneumothorax. No acute
osseous findings are identified.
IMPRESSION: Stable chest.  No active cardiopulmonary process.
